# Patient Record
Sex: Female | Born: 1955 | Race: White | Hispanic: No | Smoking: Former smoker
Health system: Southern US, Community
[De-identification: ages and names within clinical notes are randomized; demographics above are authoritative.]

## PROBLEM LIST (undated history)

## (undated) DIAGNOSIS — M069 Rheumatoid arthritis, unspecified: Secondary | ICD-10-CM

## (undated) DIAGNOSIS — M810 Age-related osteoporosis without current pathological fracture: Secondary | ICD-10-CM

## (undated) DIAGNOSIS — C9590 Leukemia, unspecified not having achieved remission: Secondary | ICD-10-CM

## (undated) DIAGNOSIS — M1990 Unspecified osteoarthritis, unspecified site: Secondary | ICD-10-CM

## (undated) DIAGNOSIS — I10 Essential (primary) hypertension: Secondary | ICD-10-CM

## (undated) DIAGNOSIS — R569 Unspecified convulsions: Secondary | ICD-10-CM

## (undated) HISTORY — PX: APPENDECTOMY: SHX54

## (undated) HISTORY — PX: BACK SURGERY: SHX140

## (undated) HISTORY — PX: ABDOMINAL HYSTERECTOMY: SHX81

## (undated) HISTORY — PX: OTHER SURGICAL HISTORY: SHX169

## (undated) HISTORY — PX: SHOULDER SURGERY: SHX246

## (undated) HISTORY — PX: NECK SURGERY: SHX720

---

## 2016-08-01 ENCOUNTER — Emergency Department (HOSPITAL_BASED_OUTPATIENT_CLINIC_OR_DEPARTMENT_OTHER)
Admission: EM | Admit: 2016-08-01 | Discharge: 2016-08-01 | Disposition: A | Discharge status: Home / Self Care | Payer: Medicaid Other | Attending: Emergency Medicine | Admitting: Emergency Medicine

## 2016-08-01 ENCOUNTER — Encounter (HOSPITAL_BASED_OUTPATIENT_CLINIC_OR_DEPARTMENT_OTHER): Payer: Self-pay | Admitting: Emergency Medicine

## 2016-08-01 ENCOUNTER — Emergency Department (HOSPITAL_BASED_OUTPATIENT_CLINIC_OR_DEPARTMENT_OTHER): Payer: Medicaid Other

## 2016-08-01 DIAGNOSIS — S80212A Abrasion, left knee, initial encounter: Secondary | ICD-10-CM | POA: Diagnosis not present

## 2016-08-01 DIAGNOSIS — Y939 Activity, unspecified: Secondary | ICD-10-CM | POA: Diagnosis not present

## 2016-08-01 DIAGNOSIS — W010XXA Fall on same level from slipping, tripping and stumbling without subsequent striking against object, initial encounter: Secondary | ICD-10-CM | POA: Insufficient documentation

## 2016-08-01 DIAGNOSIS — Y92480 Sidewalk as the place of occurrence of the external cause: Secondary | ICD-10-CM | POA: Diagnosis not present

## 2016-08-01 DIAGNOSIS — Y999 Unspecified external cause status: Secondary | ICD-10-CM | POA: Insufficient documentation

## 2016-08-01 DIAGNOSIS — S62502A Fracture of unspecified phalanx of left thumb, initial encounter for closed fracture: Secondary | ICD-10-CM

## 2016-08-01 DIAGNOSIS — W19XXXA Unspecified fall, initial encounter: Secondary | ICD-10-CM

## 2016-08-01 DIAGNOSIS — I10 Essential (primary) hypertension: Secondary | ICD-10-CM | POA: Insufficient documentation

## 2016-08-01 DIAGNOSIS — S62522A Displaced fracture of distal phalanx of left thumb, initial encounter for closed fracture: Secondary | ICD-10-CM | POA: Diagnosis not present

## 2016-08-01 DIAGNOSIS — T07XXXA Unspecified multiple injuries, initial encounter: Secondary | ICD-10-CM

## 2016-08-01 DIAGNOSIS — S6992XA Unspecified injury of left wrist, hand and finger(s), initial encounter: Secondary | ICD-10-CM | POA: Diagnosis present

## 2016-08-01 HISTORY — DX: Leukemia, unspecified not having achieved remission: C95.90

## 2016-08-01 HISTORY — DX: Essential (primary) hypertension: I10

## 2016-08-01 HISTORY — DX: Age-related osteoporosis without current pathological fracture: M81.0

## 2016-08-01 HISTORY — DX: Unspecified osteoarthritis, unspecified site: M19.90

## 2016-08-01 MED ORDER — OXYCODONE-ACETAMINOPHEN 5-325 MG PO TABS: 1.0000 | ORAL_TABLET | ORAL | 0 refills | Status: DC | PRN

## 2016-08-01 MED ORDER — OXYCODONE-ACETAMINOPHEN 5-325 MG PO TABS
1.0000 | ORAL_TABLET | Freq: Once | ORAL | Status: AC
  Administered 2016-08-01: 1 via ORAL
  Filled 2016-08-01: qty 1

## 2016-08-01 MED ORDER — ONDANSETRON HCL 4 MG/2ML IJ SOLN
4.0000 mg | Freq: Once | INTRAMUSCULAR | Status: AC
  Administered 2016-08-01: 4 mg via INTRAVENOUS
  Filled 2016-08-01: qty 2

## 2016-08-01 MED ORDER — MORPHINE SULFATE (PF) 4 MG/ML IV SOLN
4.0000 mg | Freq: Once | INTRAVENOUS | Status: AC
  Administered 2016-08-01: 4 mg via INTRAVENOUS
  Filled 2016-08-01: qty 1

## 2016-08-01 MED ORDER — BACITRACIN ZINC 500 UNIT/GM EX OINT
TOPICAL_OINTMENT | Freq: Once | CUTANEOUS | Status: AC
  Administered 2016-08-01: 18:00:00 via TOPICAL
  Filled 2016-08-01: qty 28.35

## 2016-08-01 NOTE — ED Provider Notes (Signed)
[facility] Provider Note   CSN: [id number] Arrival date & time: 08/01/16  1705  By signing my name below, I, Emmanuella Mensah, attest that this documentation has been prepared under the direction and in the presence of Lonnie Reth, PA-C. Electronically Signed: Judithann Sauger, ED Scribe. 08/01/16. 6:17 PM.   History   Chief Complaint Chief Complaint  Patient presents with  . Fall    HPI Comments: Jennifer Hernandez is a 60 y.o. female with a hx of osteoporosis, arthritis, hypertension, and leukemia who presents to the Emergency Department complaining of gradually worsening moderate bilateral knee pain and moderate bilateral hand pain with multiple abrasions to the knee and dorsum of hands s/p fall that occurred last night. She reports associated swelling and bruising to her left thumb and limited ROM of her fingers secondary to pain. Pt explains that she tripped and fell forward onto her outstretched hands and knees onto the pavement when her boyfriend accidentally stepped on the back of her flip flop. No LOC or head injuries. She states that she cleaned the abrasions but no medications noted. She denies any fever, nausea, vomiting, wrist pain, back pain, generalized rash, or any other complaints at this time.   The history is provided by the patient. No language interpreter was used.    Past Medical History:  Diagnosis Date  . Arthritis   . Hypertension   . Leukemia ([place])   . Osteoporosis     There are no active problems to display for this patient.   Past Surgical History:  Procedure Laterality Date  . ABDOMINAL HYSTERECTOMY    . arm surgery    . CESAREAN SECTION    . SHOULDER SURGERY      OB History    No data available       Home Medications    Prior to Admission medications   Not on File    Family History No family history on file.  Social History Social History  Substance Use Topics  . Smoking status: Never Smoker  . Smokeless tobacco:  Never Used  . Alcohol use No     Allergies   Aspirin; Dimetapp cold-allergy [brompheniramine-phenylephrine]; Ibuprofen; and Penicillins   Review of Systems Review of Systems  Constitutional: Negative for chills and fever.  Gastrointestinal: Negative for nausea and vomiting.  Musculoskeletal: Positive for arthralgias and joint swelling.  Skin: Positive for wound. Negative for rash.  All other systems reviewed and are negative.    Physical Exam Updated Vital Signs BP 143/84 (BP Location: Left Arm)   Pulse 87   Temp 98.2 F (36.8 C) (Oral)   Resp 16   Ht 5\' 4"  (1.626 m)   Wt 155 lb (70.3 kg)   SpO2 95%   BMI 26.61 kg/m   Physical Exam  Constitutional: She is oriented to person, place, and time. She appears well-developed and well-nourished. No distress.  HENT:  Head: Normocephalic and atraumatic.  Eyes: Conjunctivae and EOM are normal.  Neck: Neck supple. No tracheal deviation present.  Cardiovascular: Normal rate.   Pulmonary/Chest: Effort normal. No respiratory distress.  Musculoskeletal: Normal range of motion.  Left thumb ecchymotic and edematous, diffusely tender. Limited finger-thumb opposition due to pain. +snuffbox tenderness of left hand. FROM of left wrist. Sensation intact throughout. 2+ radial pulse Right dorsal knuckles grossly edematous and ecchymotic. Fingers 2-4 volarly angulated. Few small abrasions. Tender. Finger thumb opposition intact but painful. Sensation intact throughout. 2+ radial pulse.  Limited grip strength bilateral hands due to pain  No other upper extremity tenderness or deformity No hip or pelvic tenderness or instability. FROM/ Right knee with mild edema and ecchymosis, FROM. Diffusely tender.  Right shin with mild ecchymosis and tenderness. Left knee with large superficial abrasion no foreign bodies. Knee diffusely tender mildly edematous.  Limited ROM due to pain. No ankle or foot tenderness or deformity. 2+ DP and PT bilaterally No  midline back tenderness no stepoff or deformity.  Neurological: She is alert and oriented to person, place, and time.  Skin: Skin is warm and dry.  Psychiatric: She has a normal mood and affect. Her behavior is normal.  Nursing note and vitals reviewed.    ED Treatments / Results  DIAGNOSTIC STUDIES: Oxygen Saturation is 95% on RA, normal by my interpretation.    COORDINATION OF CARE: 6:04 PM- Pt advised of plan for treatment and pt agrees. She will receive bilateral hand x-ray, bilateral knee, and right tib-fib for further evaluation. She will also receive Bacitracin and percocet for pain relief.    Radiology Dg Tibia/fibula Right  Result Date: 08/01/2016 CLINICAL DATA:  Right lower leg pain following fall last night. Initial encounter. EXAM: RIGHT TIBIA AND FIBULA - 2 VIEW COMPARISON:  None. FINDINGS: There is no evidence of fracture or other focal bone lesions. Soft tissues are unremarkable. IMPRESSION: Negative. Electronically Signed   By: Margarette Canada M.D.   On: 08/01/2016 19:16   Ct Hand Left Wo Contrast  Result Date: 08/01/2016 CLINICAL DATA:  Bilateral hand pain after fall last night. Swelling and bruising. Patient with history of rheumatoid arthritis. EXAM: CT OF THE LEFT HAND WITHOUT CONTRAST TECHNIQUE: Multidetector CT imaging of the left hand was performed according to the standard protocol. Multiplanar CT image reconstructions were also generated. COMPARISON:  Radiographs earlier this day FINDINGS: Bones/Joint/Cartilage Volar subluxation of the index and long finger proximal phalanx at the metacarpal phalangeal joint. Suspect this is chronic No acute fracture of the metacarpals or proximal phalanges. There is a lucency through the thumb distal phalanx suspicious for fracture, acuity uncertain. Multifocal subchondral cysts versus erosions throughout the carpal bones as well as involving the distal radius. Marked joint space narrowing of the radiocarpal and distal radioulnar joints.  There is widening of the scapholunate articulation. Probable carpal joint effusion. Calcification of the triangular fibrocartilage. Ligaments Suboptimally assessed by CT. Widening of the scapholunate joint spaces consistent with ligament tear, appears chronic. Muscles and Tendons No acute abnormality is visualized by CT. Soft tissues No confluent hematoma. IMPRESSION: Volar subluxation of the index and long finger at the metacarpal phalangeal joint. Suspect this is chronic and consistent with patient's history of rheumatoid arthritis. Chronic changes throughout the wrist and carpal bones. Lucency through the thumb distal phalanx is consistent with fracture, acuity uncertain. Recommend correlation with point tenderness, this may be remote. Electronically Signed   By: Jeb Levering M.D.   On: 08/01/2016 22:29   Ct Hand Right Wo Contrast  Result Date: 08/01/2016 CLINICAL DATA:  Bilateral hand pain after fall. History of rheumatoid arthritis. EXAM: CT OF THE RIGHT HAND WITHOUT CONTRAST TECHNIQUE: Multidetector CT imaging of the right hand was performed according to the standard protocol. Multiplanar CT image reconstructions were also generated. COMPARISON:  Radiographs earlier this date. FINDINGS: Bones/Joint/Cartilage Volar subluxation of the index finger, long finger, and ring finger, suspect this is chronic. There is associated joint space narrowing and proliferative change involving the index finger. Erosion is noted involving the third metacarpal head. No acute fracture in this region is seen.  Multifocal erosions and joint space narrowing throughout the carpal bones and distal radial ulnar joint with bony fragmentation consistent with rheumatoid arthritis. Surgical screw is seen in the distal radius. There is settling of the proximal carpal row. Lateral plate and screw fixation of the distal ulna. Probable small carpal effusion. Ligaments Suboptimally assessed by CT. Widening of the scapholunate and dural  consistent with ligamentous tear, most certainly chronic. Muscles and Tendons No acute abnormality. Soft tissues No confluent hematoma. IMPRESSION: Volar subluxation of the index, long, and ring fingers, most certainly chronic and secondary to patient's remote for arthritis. No acute fracture is seen. Chronic changes about the wrist and carpal bones consistent with known history of inflammatory arthropathy. Electronically Signed   By: Jeb Levering M.D.   On: 08/01/2016 22:33   Dg Knee Complete 4 Views Left  Result Date: 08/01/2016 CLINICAL DATA:  Acute left knee pain following fall last night. Initial encounter. EXAM: LEFT KNEE - COMPLETE 4+ VIEW COMPARISON:  None. FINDINGS: No evidence of fracture, dislocation, or joint effusion. No evidence of arthropathy or other focal bone abnormality. Soft tissues are unremarkable. IMPRESSION: Negative. Electronically Signed   By: Margarette Canada M.D.   On: 08/01/2016 19:15   Dg Knee Complete 4 Views Right  Result Date: 08/01/2016 CLINICAL DATA:  Acute right knee pain following fall last night. Initial encounter. EXAM: RIGHT KNEE - COMPLETE 4+ VIEW COMPARISON:  None. FINDINGS: No acute fracture, subluxation or dislocation identified. No knee effusion identified. Chondrocalcinosis identified. Tricompartmental degenerative changes are noted, severe in the medial compartment and mild in the lateral and patellofemoral compartments. IMPRESSION: No evidence of acute abnormality. Tricompartmental degenerative changes, severe in the medial compartment. Chondrocalcinosis. Electronically Signed   By: Margarette Canada M.D.   On: 08/01/2016 19:15   Dg Hand Complete Left  Result Date: 08/01/2016 CLINICAL DATA:  Left hand pain following fall.  Initial encounter. EXAM: LEFT HAND - COMPLETE 3+ VIEW COMPARISON:  None. FINDINGS: Volar dislocation of the second and third MCP joints noted. No definite fractures identified. Degenerative changes within the wrist are present. IMPRESSION: Volar  dislocation at the second and third MCP joints. Electronically Signed   By: Margarette Canada M.D.   On: 08/01/2016 19:14   Dg Hand Complete Right  Result Date: 08/01/2016 CLINICAL DATA:  Acute right hand pain following fall last night. Initial encounter. EXAM: RIGHT HAND - COMPLETE 3+ VIEW COMPARISON:  None. FINDINGS: Volar dislocation at the second, third and fourth MCP joints noted. No definite acute fractures are noted. Severe degenerative/posttraumatic changes in the wrist are noted. Surgical hardware in the distal ulna identified. IMPRESSION: Volar dislocation at the second, third and fourth MCP joints. Electronically Signed   By: Margarette Canada M.D.   On: 08/01/2016 19:12    Procedures Procedures (including critical care time)  Medications Ordered in ED Medications  oxyCODONE-acetaminophen (PERCOCET/ROXICET) 5-325 MG per tablet 1 tablet (1 tablet Oral Given 08/01/16 1814)  bacitracin ointment ( Topical Given 08/01/16 1815)  morphine 4 MG/ML injection 4 mg (4 mg Intravenous Given 08/01/16 2006)  ondansetron (ZOFRAN) injection 4 mg (4 mg Intravenous Given 08/01/16 2005)  morphine 4 MG/ML injection 4 mg (4 mg Intravenous Given 08/01/16 2220)     Initial Impression / Assessment and Plan / ED Course  Delrae Rend, PA-C has reviewed the triage vital signs and the nursing notes.  Pertinent labs & imaging results that were available during my care of the patient were reviewed by me and considered in my medical  decision making (see chart for details).  Clinical Course    Initial hand X-rays showed dislocation of several MCP joints bilaterally. Dr. Winfred Leeds independently evaluated patient and agrees that this is unusual and recommended hand surgery consultation for further recommendations. Rest of x-rays were negative. After discussion with Dr. Grandville Silos of hand surgery and further Care Everywhere review, pt does have a history of rheumatoid arthritis (had initially only reported osteoarthritis) and Dr.  Grandville Silos feels these dislocations could likely be chronic secondary to her RA and recommends CT of both hands. CT were obtained and the dislocations do appear chronic. However, she does have a distal phalanx fracture of her left thumb which is the area that is giving her the most pain tonight. She was placed in a finger splint and I encouraged close follow up with hand surgery/ortho. Pt sees Dr. Laurance Flatten at [facility] ([facility] and would like to follow up at his practice. Rx given for supportive meds. Care instructions discussed for pt's abrasions. ER return precautions given.  Final Clinical Impressions(s) / ED Diagnoses   Final diagnoses:  Fall, initial encounter  Thumb fracture, left, closed, initial encounter  Multiple abrasions    New Prescriptions Discharge Medication List as of 08/01/2016 11:10 PM    START taking these medications   Details  oxyCODONE-acetaminophen (PERCOCET/ROXICET) 5-325 MG tablet Take 1 tablet by mouth every 4 (four) hours as needed for severe pain., Starting Sat 08/01/2016, Print       I personally performed the services described in this documentation, which was scribed in my presence. The recorded information has been reviewed and is accurate.    Anne Ng, PA-C 08/02/16 2155    Orlie Dakin, MD 08/03/16 [phone]

## 2016-08-01 NOTE — ED Provider Notes (Signed)
Patient tripped and fell 12 midnight tonight on her outstretched hands. Complains of bilateral hand pain and bilateral knee pain since event. On exam alert Glasgow Coma Score 15 bilateral upper extremities hands with deformities at MCP joints. All digits with good capillary refill. Lower extremities with abrasions overlying anterior knees. Neurovascularly intact.   Orlie Dakin, MD 08/01/16 2330

## 2016-08-01 NOTE — Discharge Instructions (Signed)
Take pain medication as prescribed as needed for pain. Follow up with your orthopedic surgeon as soon as possible.

## 2016-08-01 NOTE — ED Triage Notes (Signed)
Pt states she tripped and fell on pavement last night when boyfriend stepped on her flip flop.  Pt c/o right hand pain, left thumb and left knee pain.  Visible swelling and bruising to each.

## 2016-12-17 ENCOUNTER — Observation Stay (HOSPITAL_BASED_OUTPATIENT_CLINIC_OR_DEPARTMENT_OTHER)
Admission: EM | Admit: 2016-12-17 | Discharge: 2016-12-18 | Disposition: A | Discharge status: Home / Self Care | Payer: Medicaid Other | Attending: Internal Medicine | Admitting: Internal Medicine

## 2016-12-17 ENCOUNTER — Emergency Department (HOSPITAL_BASED_OUTPATIENT_CLINIC_OR_DEPARTMENT_OTHER): Payer: Medicaid Other

## 2016-12-17 ENCOUNTER — Encounter (HOSPITAL_BASED_OUTPATIENT_CLINIC_OR_DEPARTMENT_OTHER): Payer: Self-pay | Admitting: *Deleted

## 2016-12-17 DIAGNOSIS — Z888 Allergy status to other drugs, medicaments and biological substances status: Secondary | ICD-10-CM | POA: Insufficient documentation

## 2016-12-17 DIAGNOSIS — D803 Selective deficiency of immunoglobulin G [IgG] subclasses: Secondary | ICD-10-CM | POA: Insufficient documentation

## 2016-12-17 DIAGNOSIS — M069 Rheumatoid arthritis, unspecified: Secondary | ICD-10-CM | POA: Diagnosis present

## 2016-12-17 DIAGNOSIS — D899 Disorder involving the immune mechanism, unspecified: Secondary | ICD-10-CM | POA: Diagnosis present

## 2016-12-17 DIAGNOSIS — Z886 Allergy status to analgesic agent status: Secondary | ICD-10-CM | POA: Diagnosis not present

## 2016-12-17 DIAGNOSIS — I700 Atherosclerosis of aorta: Secondary | ICD-10-CM | POA: Insufficient documentation

## 2016-12-17 DIAGNOSIS — Z87891 Personal history of nicotine dependence: Secondary | ICD-10-CM | POA: Insufficient documentation

## 2016-12-17 DIAGNOSIS — E876 Hypokalemia: Secondary | ICD-10-CM | POA: Diagnosis not present

## 2016-12-17 DIAGNOSIS — D849 Immunodeficiency, unspecified: Secondary | ICD-10-CM

## 2016-12-17 DIAGNOSIS — Z880 Allergy status to penicillin: Secondary | ICD-10-CM | POA: Diagnosis not present

## 2016-12-17 DIAGNOSIS — M810 Age-related osteoporosis without current pathological fracture: Secondary | ICD-10-CM | POA: Diagnosis not present

## 2016-12-17 DIAGNOSIS — Z90710 Acquired absence of both cervix and uterus: Secondary | ICD-10-CM | POA: Diagnosis not present

## 2016-12-17 DIAGNOSIS — J209 Acute bronchitis, unspecified: Principal | ICD-10-CM | POA: Diagnosis present

## 2016-12-17 DIAGNOSIS — C9590 Leukemia, unspecified not having achieved remission: Secondary | ICD-10-CM | POA: Diagnosis not present

## 2016-12-17 DIAGNOSIS — J45909 Unspecified asthma, uncomplicated: Secondary | ICD-10-CM | POA: Insufficient documentation

## 2016-12-17 DIAGNOSIS — I10 Essential (primary) hypertension: Secondary | ICD-10-CM | POA: Diagnosis present

## 2016-12-17 DIAGNOSIS — J189 Pneumonia, unspecified organism: Secondary | ICD-10-CM | POA: Diagnosis present

## 2016-12-17 DIAGNOSIS — Z79899 Other long term (current) drug therapy: Secondary | ICD-10-CM | POA: Diagnosis not present

## 2016-12-17 DIAGNOSIS — Z856 Personal history of leukemia: Secondary | ICD-10-CM | POA: Diagnosis not present

## 2016-12-17 HISTORY — DX: Rheumatoid arthritis, unspecified: M06.9

## 2016-12-17 HISTORY — DX: Unspecified convulsions: R56.9

## 2016-12-17 LAB — TROPONIN I

## 2016-12-17 LAB — CBC WITH DIFFERENTIAL/PLATELET
BASOS ABS: 0 10*3/uL (ref 0.0–0.1)
BASOS PCT: 0 %
EOS ABS: 0 10*3/uL (ref 0.0–0.7)
Eosinophils Relative: 0 %
HEMATOCRIT: 35.8 % — AB (ref 36.0–46.0)
Hemoglobin: 12.1 g/dL (ref 12.0–15.0)
Lymphocytes Relative: 6 %
Lymphs Abs: 0.7 10*3/uL (ref 0.7–4.0)
MCH: 32.4 pg (ref 26.0–34.0)
MCHC: 33.8 g/dL (ref 30.0–36.0)
MCV: 95.7 fL (ref 78.0–100.0)
MONO ABS: 0.4 10*3/uL (ref 0.1–1.0)
Monocytes Relative: 3 %
NEUTROS ABS: 10.9 10*3/uL — AB (ref 1.7–7.7)
NEUTROS PCT: 91 %
Platelets: 338 10*3/uL (ref 150–400)
RBC: 3.74 MIL/uL — ABNORMAL LOW (ref 3.87–5.11)
RDW: 15.7 % — AB (ref 11.5–15.5)
WBC: 12 10*3/uL — ABNORMAL HIGH (ref 4.0–10.5)

## 2016-12-17 LAB — BASIC METABOLIC PANEL
Anion gap: 8 (ref 5–15)
BUN: 17 mg/dL (ref 6–20)
CALCIUM: 8.9 mg/dL (ref 8.9–10.3)
CHLORIDE: 104 mmol/L (ref 101–111)
CO2: 26 mmol/L (ref 22–32)
Creatinine, Ser: 0.41 mg/dL — ABNORMAL LOW (ref 0.44–1.00)
Glucose, Bld: 95 mg/dL (ref 65–99)
Potassium: 3.2 mmol/L — ABNORMAL LOW (ref 3.5–5.1)
SODIUM: 138 mmol/L (ref 135–145)

## 2016-12-17 LAB — INFLUENZA PANEL BY PCR (TYPE A & B)
Influenza A By PCR: NEGATIVE
Influenza B By PCR: NEGATIVE

## 2016-12-17 LAB — URINALYSIS, ROUTINE W REFLEX MICROSCOPIC
Bilirubin Urine: NEGATIVE
GLUCOSE, UA: NEGATIVE mg/dL
HGB URINE DIPSTICK: NEGATIVE
Ketones, ur: NEGATIVE mg/dL
LEUKOCYTES UA: NEGATIVE
Nitrite: NEGATIVE
PH: 6.5 (ref 5.0–8.0)
Protein, ur: NEGATIVE mg/dL
SPECIFIC GRAVITY, URINE: 1.012 (ref 1.005–1.030)

## 2016-12-17 LAB — I-STAT CG4 LACTIC ACID, ED: LACTIC ACID, VENOUS: 1.91 mmol/L — AB (ref 0.5–1.9)

## 2016-12-17 LAB — PROTIME-INR
INR: 0.92
Prothrombin Time: 12.3 seconds (ref 11.4–15.2)

## 2016-12-17 LAB — TSH: TSH: 0.112 u[IU]/mL — AB (ref 0.350–4.500)

## 2016-12-17 MED ORDER — ONDANSETRON HCL 4 MG PO TABS: 4.0000 mg | ORAL_TABLET | Freq: Four times a day (QID) | ORAL | Status: DC | PRN

## 2016-12-17 MED ORDER — ACETAMINOPHEN 650 MG RE SUPP
650.0000 mg | Freq: Four times a day (QID) | RECTAL | Status: DC | PRN
Start: 2016-12-17 — End: 2016-12-18

## 2016-12-17 MED ORDER — QUETIAPINE FUMARATE 400 MG PO TABS
400.0000 mg | ORAL_TABLET | Freq: Every day | ORAL | Status: DC
  Administered 2016-12-17: 400 mg via ORAL
  Filled 2016-12-17: qty 1

## 2016-12-17 MED ORDER — LEFLUNOMIDE 20 MG PO TABS
10.0000 mg | ORAL_TABLET | Freq: Every day | ORAL | Status: DC
  Administered 2016-12-18: 10 mg via ORAL
  Filled 2016-12-17: qty 1

## 2016-12-17 MED ORDER — SODIUM CHLORIDE 0.9 % IV BOLUS (SEPSIS)
1000.0000 mL | Freq: Once | INTRAVENOUS | Status: AC
  Administered 2016-12-17: 1000 mL via INTRAVENOUS

## 2016-12-17 MED ORDER — METHYLPREDNISOLONE SODIUM SUCC 125 MG IJ SOLR
125.0000 mg | Freq: Once | INTRAMUSCULAR | Status: AC
  Administered 2016-12-17: 125 mg via INTRAVENOUS
  Filled 2016-12-17: qty 2

## 2016-12-17 MED ORDER — HYDROCHLOROTHIAZIDE 25 MG PO TABS
25.0000 mg | ORAL_TABLET | Freq: Every day | ORAL | Status: DC
  Administered 2016-12-18: 25 mg via ORAL
  Filled 2016-12-17: qty 1

## 2016-12-17 MED ORDER — SPIRONOLACTONE 25 MG PO TABS
25.0000 mg | ORAL_TABLET | Freq: Every day | ORAL | Status: DC
  Administered 2016-12-18: 25 mg via ORAL
  Filled 2016-12-17: qty 1

## 2016-12-17 MED ORDER — SENNOSIDES-DOCUSATE SODIUM 8.6-50 MG PO TABS
2.0000 | ORAL_TABLET | Freq: Two times a day (BID) | ORAL | Status: DC
  Administered 2016-12-17 – 2016-12-18 (×2): 2 via ORAL
  Filled 2016-12-17 (×2): qty 2

## 2016-12-17 MED ORDER — ALBUTEROL SULFATE (2.5 MG/3ML) 0.083% IN NEBU
5.0000 mg | INHALATION_SOLUTION | Freq: Once | RESPIRATORY_TRACT | Status: AC
  Administered 2016-12-17: 5 mg via RESPIRATORY_TRACT
  Filled 2016-12-17: qty 6

## 2016-12-17 MED ORDER — PANTOPRAZOLE SODIUM 40 MG PO TBEC
40.0000 mg | DELAYED_RELEASE_TABLET | Freq: Every day | ORAL | Status: DC
  Administered 2016-12-18: 40 mg via ORAL
  Filled 2016-12-17: qty 1

## 2016-12-17 MED ORDER — GABAPENTIN 300 MG PO CAPS
600.0000 mg | ORAL_CAPSULE | Freq: Every day | ORAL | Status: DC
  Administered 2016-12-17: 600 mg via ORAL
  Filled 2016-12-17: qty 2

## 2016-12-17 MED ORDER — ACETAMINOPHEN 325 MG PO TABS: 650.0000 mg | ORAL_TABLET | Freq: Four times a day (QID) | ORAL | Status: DC | PRN

## 2016-12-17 MED ORDER — POTASSIUM CHLORIDE CRYS ER 20 MEQ PO TBCR
40.0000 meq | EXTENDED_RELEASE_TABLET | Freq: Once | ORAL | Status: AC
  Administered 2016-12-17: 40 meq via ORAL
  Filled 2016-12-17: qty 2

## 2016-12-17 MED ORDER — FLUTICASONE PROPIONATE 50 MCG/ACT NA SUSP
1.0000 | Freq: Every day | NASAL | Status: DC
  Administered 2016-12-18: 1 via NASAL
  Filled 2016-12-17: qty 16

## 2016-12-17 MED ORDER — ETANERCEPT 50 MG/ML ~~LOC~~ SOCT: 50.0000 mg | SUBCUTANEOUS | Status: DC

## 2016-12-17 MED ORDER — LEVOFLOXACIN 750 MG PO TABS
750.0000 mg | ORAL_TABLET | Freq: Every day | ORAL | Status: DC
  Administered 2016-12-18: 750 mg via ORAL
  Filled 2016-12-17: qty 1

## 2016-12-17 MED ORDER — CALCIUM CARBONATE-VITAMIN D 500-200 MG-UNIT PO TABS
2.0000 | ORAL_TABLET | Freq: Every day | ORAL | Status: DC
  Administered 2016-12-18: 08:00:00 2 via ORAL
  Filled 2016-12-17: qty 1

## 2016-12-17 MED ORDER — CALCIUM CARB-CHOLECALCIFEROL 1000-800 MG-UNIT PO TABS: 1.0000 | ORAL_TABLET | Freq: Every day | ORAL | Status: DC

## 2016-12-17 MED ORDER — HEPARIN SODIUM (PORCINE) 5000 UNIT/ML IJ SOLN
5000.0000 [IU] | Freq: Three times a day (TID) | INTRAMUSCULAR | Status: DC
  Administered 2016-12-17 – 2016-12-18 (×2): 5000 [IU] via SUBCUTANEOUS
  Filled 2016-12-17 (×2): qty 1

## 2016-12-17 MED ORDER — BUTALBITAL-APAP-CAFFEINE 50-325-40 MG PO TABS
1.0000 | ORAL_TABLET | Freq: Once | ORAL | Status: DC
  Filled 2016-12-17: qty 1

## 2016-12-17 MED ORDER — IPRATROPIUM-ALBUTEROL 0.5-2.5 (3) MG/3ML IN SOLN
3.0000 mL | RESPIRATORY_TRACT | Status: DC
  Administered 2016-12-17: 3 mL via RESPIRATORY_TRACT
  Filled 2016-12-17: qty 3

## 2016-12-17 MED ORDER — AMLODIPINE BESYLATE 5 MG PO TABS
5.0000 mg | ORAL_TABLET | Freq: Every day | ORAL | Status: DC
Start: 2016-12-18 — End: 2016-12-18
  Administered 2016-12-18: 5 mg via ORAL
  Filled 2016-12-17: qty 1

## 2016-12-17 MED ORDER — DEXTROSE 5 % IV SOLN
500.0000 mg | Freq: Once | INTRAVENOUS | Status: AC
  Administered 2016-12-17: 500 mg via INTRAVENOUS

## 2016-12-17 MED ORDER — BUSPIRONE HCL 10 MG PO TABS
30.0000 mg | ORAL_TABLET | Freq: Two times a day (BID) | ORAL | Status: DC
  Administered 2016-12-17 – 2016-12-18 (×2): 30 mg via ORAL
  Filled 2016-12-17 (×2): qty 3

## 2016-12-17 MED ORDER — SPIRONOLACTONE-HCTZ 25-25 MG PO TABS: 1.0000 | ORAL_TABLET | Freq: Every day | ORAL | Status: DC

## 2016-12-17 MED ORDER — IPRATROPIUM-ALBUTEROL 0.5-2.5 (3) MG/3ML IN SOLN: 3.0000 mL | Freq: Four times a day (QID) | RESPIRATORY_TRACT | Status: DC

## 2016-12-17 MED ORDER — ONDANSETRON HCL 4 MG/2ML IJ SOLN: 4.0000 mg | Freq: Four times a day (QID) | INTRAMUSCULAR | Status: DC | PRN

## 2016-12-17 MED ORDER — DEXTROSE 5 % IV SOLN
1.0000 g | Freq: Once | INTRAVENOUS | Status: AC
  Administered 2016-12-17: 1 g via INTRAVENOUS
  Filled 2016-12-17: qty 10

## 2016-12-17 MED ORDER — DIPHENHYDRAMINE HCL 50 MG/ML IJ SOLN
12.5000 mg | Freq: Once | INTRAMUSCULAR | Status: AC
  Administered 2016-12-17: 12.5 mg via INTRAVENOUS
  Filled 2016-12-17: qty 1

## 2016-12-17 MED ORDER — PREDNISONE 10 MG PO TABS
10.0000 mg | ORAL_TABLET | Freq: Every day | ORAL | Status: DC
  Administered 2016-12-18: 10 mg via ORAL
  Filled 2016-12-17: qty 1

## 2016-12-17 MED ORDER — HYDROCODONE-ACETAMINOPHEN 5-325 MG PO TABS
1.0000 | ORAL_TABLET | ORAL | Status: DC | PRN
  Administered 2016-12-17 – 2016-12-18 (×4): 2 via ORAL
  Filled 2016-12-17 (×4): qty 2

## 2016-12-17 MED ORDER — METOCLOPRAMIDE HCL 5 MG/ML IJ SOLN
10.0000 mg | Freq: Once | INTRAMUSCULAR | Status: AC
  Administered 2016-12-17: 10 mg via INTRAVENOUS
  Filled 2016-12-17: qty 2

## 2016-12-17 MED ORDER — ALBUTEROL SULFATE HFA 108 (90 BASE) MCG/ACT IN AERS: 2.0000 | INHALATION_SPRAY | RESPIRATORY_TRACT | Status: DC | PRN

## 2016-12-17 MED ORDER — CITALOPRAM HYDROBROMIDE 20 MG PO TABS
20.0000 mg | ORAL_TABLET | Freq: Every day | ORAL | Status: DC
  Administered 2016-12-18: 20 mg via ORAL
  Filled 2016-12-17: qty 1

## 2016-12-17 MED ORDER — ACETAMINOPHEN 325 MG PO TABS
650.0000 mg | ORAL_TABLET | Freq: Once | ORAL | Status: AC
  Administered 2016-12-17: 650 mg via ORAL
  Filled 2016-12-17: qty 2

## 2016-12-17 MED ORDER — ACETAMINOPHEN 325 MG PO TABS: 650.0000 mg | ORAL_TABLET | Freq: Once | ORAL | Status: DC

## 2016-12-17 MED ORDER — AZITHROMYCIN 500 MG IV SOLR
INTRAVENOUS | Status: AC
  Filled 2016-12-17: qty 500

## 2016-12-17 MED ORDER — ALBUTEROL SULFATE (2.5 MG/3ML) 0.083% IN NEBU: 2.5000 mg | INHALATION_SOLUTION | RESPIRATORY_TRACT | Status: DC | PRN

## 2016-12-17 MED ORDER — FOLIC ACID 1 MG PO TABS
1.0000 mg | ORAL_TABLET | Freq: Every day | ORAL | Status: DC
  Administered 2016-12-18: 1 mg via ORAL
  Filled 2016-12-17: qty 1

## 2016-12-17 MED ORDER — GUAIFENESIN ER 600 MG PO TB12
1200.0000 mg | ORAL_TABLET | Freq: Two times a day (BID) | ORAL | Status: DC
  Administered 2016-12-17 – 2016-12-18 (×2): 1200 mg via ORAL
  Filled 2016-12-17 (×2): qty 2

## 2016-12-17 MED ORDER — POTASSIUM CHLORIDE CRYS ER 10 MEQ PO TBCR: 10.0000 meq | EXTENDED_RELEASE_TABLET | Freq: Two times a day (BID) | ORAL | Status: DC

## 2016-12-17 MED ORDER — MIRABEGRON ER 25 MG PO TB24
25.0000 mg | ORAL_TABLET | Freq: Two times a day (BID) | ORAL | Status: DC
  Administered 2016-12-17 – 2016-12-18 (×2): 25 mg via ORAL
  Filled 2016-12-17 (×2): qty 1

## 2016-12-17 MED ORDER — METHOTREXATE 2.5 MG PO TABS: 15.0000 mg | ORAL_TABLET | ORAL | Status: DC

## 2016-12-17 MED ORDER — IPRATROPIUM BROMIDE 0.02 % IN SOLN
0.5000 mg | Freq: Once | RESPIRATORY_TRACT | Status: AC
  Administered 2016-12-17: 0.5 mg via RESPIRATORY_TRACT
  Filled 2016-12-17: qty 2.5

## 2016-12-17 MED ORDER — METHOCARBAMOL 500 MG PO TABS
750.0000 mg | ORAL_TABLET | Freq: Four times a day (QID) | ORAL | Status: DC
  Administered 2016-12-17 – 2016-12-18 (×3): 750 mg via ORAL
  Filled 2016-12-17 (×3): qty 2

## 2016-12-17 MED ORDER — PRAMIPEXOLE DIHYDROCHLORIDE 0.25 MG PO TABS
0.2500 mg | ORAL_TABLET | Freq: Three times a day (TID) | ORAL | Status: DC
  Administered 2016-12-17 – 2016-12-18 (×2): 0.25 mg via ORAL
  Filled 2016-12-17 (×4): qty 1

## 2016-12-17 MED ORDER — SODIUM CHLORIDE 0.9 % IV SOLN: INTRAVENOUS | Status: DC

## 2016-12-17 MED ORDER — GUAIFENESIN-DM 100-10 MG/5ML PO SYRP: 5.0000 mL | ORAL_SOLUTION | ORAL | Status: DC | PRN

## 2016-12-17 MED ORDER — ESTROGENS CONJUGATED 1.25 MG PO TABS
1.2500 mg | ORAL_TABLET | Freq: Every day | ORAL | Status: DC
  Administered 2016-12-18: 1.25 mg via ORAL
  Filled 2016-12-17: qty 1

## 2016-12-17 NOTE — ED Notes (Signed)
Report given to [facility] on 3[facility].

## 2016-12-17 NOTE — ED Notes (Signed)
Talked with Lattie Haw PA about lactic acid and she states to cancel 2nd lactic acid.

## 2016-12-17 NOTE — ED Notes (Signed)
Turned over care to [facility] transport team. PT in stable condition.

## 2016-12-17 NOTE — ED Notes (Signed)
Report given to [facility] EMT-P with [facility].

## 2016-12-17 NOTE — ED Notes (Signed)
[facility] here to transport pt to [facility] 

## 2016-12-17 NOTE — Plan of Care (Addendum)
   Jennifer Hernandez is a 61 y.o. female with a Past Medical History significant for leukemia and getting active treatment IG deficiency replacment who presents with CAP. Found to be in respiratory distress. Case was discussed by EDP with patient's oncologist and they advised inpatient admission for IV antibiotics.  Getting chemo actively. On immunosuppressants for RA.  EDP to order flu swab.  Accepted as tele obs admit to [facility].  Elwin Mocha, MD Family Medicine See Amion for pager # [facility]

## 2016-12-17 NOTE — ED Notes (Signed)
Attempted IV x 2 unsuccessful, to [place] and [facility]

## 2016-12-17 NOTE — H&P (Signed)
History and Physical    [name] [id number] DOB: 08-Oct-1956 DOA: 12/17/2016  PCP: Sinclair Ship, MD  Patient coming from: Home  Chief Complaint: SOB and cough  HPI: Jennifer Hernandez is a 61 y.o. female with medical history significant of leukemia, IgG deficiency, has history of rheumatoid arthritis on immunomodulators, patient came into the hospital complaining about subacute cough. Patient reported that before Christmas she had cough that comes and goes, she went to see her primary care physician twice for this. For the past 2-3 days she reported fever of 101 at home, cough is productive of some phlegm which described as yellow. She denies any other complaints, denies any chest pain denies any palpitations.  ED Course:  Vitals: WNL, no fever documented in the ED. Labs: Hypokalemia with potassium of 3.2 Imaging: CXR without findings to suggest pneumonia Interventions: Referred for observation  Review of Systems:  Constitutional: Fever Eyes: negative for irritation, redness and visual disturbance Ears, nose, mouth, throat, and face: negative for earaches, epistaxis, nasal congestion and sore throat Respiratory: Per history of present illness Cardiovascular: negative for chest pain, dyspnea, lower extremity edema, orthopnea, palpitations and syncope Gastrointestinal: negative for abdominal pain, constipation, diarrhea, melena, nausea and vomiting Genitourinary:negative for dysuria, frequency and hematuria Hematologic/lymphatic: negative for bleeding, easy bruising and lymphadenopathy Musculoskeletal:negative for arthralgias, muscle weakness and stiff joints Neurological: negative for coordination problems, gait problems, headaches and weakness Endocrine: negative for diabetic symptoms including polydipsia, polyuria and weight loss Allergic/Immunologic: negative for anaphylaxis, hay fever and urticaria  Past Medical History:  Diagnosis Date  . Arthritis   . Hypertension   .  Leukemia ([place])   . Osteoporosis   . RA (rheumatoid arthritis) ([place])   . Seizures ([place])    last seizure in 2012    Past Surgical History:  Procedure Laterality Date  . ABDOMINAL HYSTERECTOMY    . APPENDECTOMY    . arm surgery    . BACK SURGERY    . CESAREAN SECTION    . NECK SURGERY    . SHOULDER SURGERY       reports that she has quit smoking. She has never used smokeless tobacco. She reports that she does not drink alcohol or use drugs.  Allergies  Allergen Reactions  . Aspirin Shortness Of Breath  . Brompheniramine Anaphylaxis  . Dimetapp Cold-Allergy [Brompheniramine-Phenylephrine] Shortness Of Breath  . Ibuprofen Shortness Of Breath  . Penicillins Shortness Of Breath  . Tolmetin Anaphylaxis  . Doxycycline Nausea And Vomiting  . Penicillin G Rash   Family history Denies any premature history of CAD.  Prior to Admission medications   Medication Sig Start Date End Date Taking? Authorizing Provider  albuterol (PROVENTIL HFA;VENTOLIN HFA) 108 (90 Base) MCG/ACT inhaler Inhale 2 puffs into the lungs every 4 (four) hours as needed for wheezing or shortness of breath.   Yes Historical Provider, MD  amLODipine (NORVASC) 5 MG tablet Take 5 mg by mouth daily.   Yes Historical Provider, MD  busPIRone (BUSPAR) 10 MG tablet Take 10 mg by mouth 3 (three) times daily.   Yes Historical Provider, MD  Calcium Carb-Cholecalciferol (CALCIUM 1000 + D) 1000-800 MG-UNIT TABS Take 1 tablet by mouth daily.   Yes Historical Provider, MD  citalopram (CELEXA) 20 MG tablet Take 20 mg by mouth daily.   Yes Historical Provider, MD  esomeprazole (NEXIUM) 20 MG capsule Take 20 mg by mouth 2 (two) times daily before a meal.   Yes Historical Provider, MD  estrogens, conjugated, (PREMARIN) 1.25 MG tablet  Take 1.25 mg by mouth daily.   Yes Historical Provider, MD  Etanercept 50 MG/ML SOCT Inject 50 mg into the skin once a week.   Yes Historical Provider, MD  fexofenadine (ALLEGRA) 60 MG tablet Take 60 mg by  mouth 2 (two) times daily.   Yes Historical Provider, MD  fluticasone (FLONASE) 50 MCG/ACT nasal spray Place 1 spray into both nostrils daily.   Yes Historical Provider, MD  Fluticasone-Salmeterol (ADVAIR) 500-50 MCG/DOSE AEPB Inhale 1 puff into the lungs 2 (two) times daily.   Yes Historical Provider, MD  folic acid (FOLVITE) 1 MG tablet Take 1 mg by mouth daily.   Yes Historical Provider, MD  gabapentin (NEURONTIN) 300 MG capsule Take 600 mg by mouth at bedtime.   Yes Historical Provider, MD  leflunomide (ARAVA) 10 MG tablet Take 10 mg by mouth daily.   Yes Historical Provider, MD  methocarbamol (ROBAXIN) 750 MG tablet Take 750 mg by mouth 4 (four) times daily.   Yes Historical Provider, MD  methotrexate 2.5 MG tablet Take 15 mg by mouth once a week.   Yes Historical Provider, MD  mirabegron ER (MYRBETRIQ) 25 MG TB24 tablet Take 25 mg by mouth 2 (two) times daily.   Yes Historical Provider, MD  oxyCODONE-acetaminophen (PERCOCET/ROXICET) 5-325 MG tablet Take 1 tablet by mouth every 4 (four) hours as needed for severe pain. 08/01/16  Yes Olivia Canter Sam, PA-C  potassium chloride (K-DUR,KLOR-CON) 10 MEQ tablet Take 10 mEq by mouth 2 (two) times daily.   Yes Historical Provider, MD  pramipexole (MIRAPEX) 0.25 MG tablet Take 0.25 mg by mouth 3 (three) times daily.   Yes Historical Provider, MD  predniSONE (DELTASONE) 5 MG tablet Take 5 mg by mouth daily with breakfast.   Yes Historical Provider, MD  QUEtiapine (SEROQUEL) 400 MG tablet Take 400 mg by mouth at bedtime.   Yes Historical Provider, MD  senna-docusate (SENOKOT-S) 8.6-50 MG tablet Take 2 tablets by mouth 2 (two) times daily.   Yes Historical Provider, MD  spironolactone-hydrochlorothiazide (ALDACTAZIDE) 25-25 MG tablet Take 1 tablet by mouth daily.   Yes Historical Provider, MD    Physical Exam:  Vitals:   12/17/16 1430 12/17/16 1509 12/17/16 1530 12/17/16 1630  BP: 123/58 108/61 109/63 (!) 143/75  Pulse: 93 107 103 (!) 107  Resp: 19 20 22   (!) 24  Temp:    98.3 F (36.8 C)  TempSrc:    Oral  SpO2: 95% 97% 97% 97%  Weight:    70.5 kg (155 lb 6.4 oz)  Height:    5\' 4"  (1.626 m)    Constitutional: NAD, calm, comfortable Eyes: PERRL, lids and conjunctivae normal ENMT: Mucous membranes are moist. Posterior pharynx clear of any exudate or lesions.Normal dentition.  Neck: normal, supple, no masses, no thyromegaly Respiratory: clear to auscultation bilaterally, no wheezing, no crackles. Normal respiratory effort. No accessory muscle use.  Cardiovascular: Regular rate and rhythm, no murmurs / rubs / gallops. No extremity edema. 2+ pedal pulses. No carotid bruits.  Abdomen: no tenderness, no masses palpated. No hepatosplenomegaly. Bowel sounds positive.  Musculoskeletal: no clubbing / cyanosis. No joint deformity upper and lower extremities. Good ROM, no contractures. Normal muscle tone.  Skin: no rashes, lesions, ulcers. No induration Neurologic: CN 2-12 grossly intact. Sensation intact, DTR normal. Strength 5/5 in all 4.  Psychiatric: Normal judgment and insight. Alert and oriented x 3. Normal mood.   Labs on Admission: I have personally reviewed following labs and imaging studies  CBC:  Recent  Labs Lab 12/17/16 1135  WBC 12.0*  NEUTROABS 10.9*  HGB 12.1  HCT 35.8*  MCV 95.7  PLT [id number]   Basic Metabolic Panel:  Recent Labs Lab 12/17/16 1135  NA 138  K 3.2*  CL 104  CO2 26  GLUCOSE 95  BUN 17  CREATININE 0.41*  CALCIUM 8.9   GFR: Estimated Creatinine Clearance: 73.8 mL/min (by C-G formula based on SCr of 0.41 mg/dL (L)). Liver Function Tests: No results for input(s): AST, ALT, ALKPHOS, BILITOT, PROT, ALBUMIN in the last 168 hours. No results for input(s): LIPASE, AMYLASE in the last 168 hours. No results for input(s): AMMONIA in the last 168 hours. Coagulation Profile: No results for input(s): INR, PROTIME in the last 168 hours. Cardiac Enzymes:  Recent Labs Lab 12/17/16 1135  TROPONINI <0.03   BNP  (last 3 results) No results for input(s): PROBNP in the last 8760 hours. HbA1C: No results for input(s): HGBA1C in the last 72 hours. CBG: No results for input(s): GLUCAP in the last 168 hours. Lipid Profile: No results for input(s): CHOL, HDL, LDLCALC, TRIG, CHOLHDL, LDLDIRECT in the last 72 hours. Thyroid Function Tests: No results for input(s): TSH, T4TOTAL, FREET4, T3FREE, THYROIDAB in the last 72 hours. Anemia Panel: No results for input(s): VITAMINB12, FOLATE, FERRITIN, TIBC, IRON, RETICCTPCT in the last 72 hours. Urine analysis:    Component Value Date/Time   COLORURINE YELLOW 12/17/2016 1225   APPEARANCEUR CLEAR 12/17/2016 1225   LABSPEC 1.012 12/17/2016 1225   PHURINE 6.5 12/17/2016 1225   GLUCOSEU NEGATIVE 12/17/2016 1225   HGBUR NEGATIVE 12/17/2016 [place] 12/17/2016 1225   KETONESUR NEGATIVE 12/17/2016 1225   PROTEINUR NEGATIVE 12/17/2016 1225   NITRITE NEGATIVE 12/17/2016 1225   LEUKOCYTESUR NEGATIVE 12/17/2016 1225   Sepsis Labs: !!!!!!!!!!!!!!!!!!!!!!!!!!!!!!!!!!!!!!!!!!!! Invalid input(s): PROCALCITONIN, LACTICIDVEN Recent Results (from the past 240 hour(s))  Blood culture (routine x 2)     Status: None (Preliminary result)   Collection Time: 12/17/16 12:00 PM  Result Value Ref Range Status   Specimen Description   Final    BLOOD LEFT ARM Performed at [facility], [address], [place], [place] [address]    Special Requests BOTTLES DRAWN AEROBIC AND ANAEROBIC 4CC  Final   Culture PENDING  Incomplete   Report Status PENDING  Incomplete     Radiological Exams on Admission: Dg Chest 2 View  Result Date: 12/17/2016 CLINICAL DATA:  Cough, fever, shortness of breath. EXAM: CHEST  2 VIEW COMPARISON:  None. FINDINGS: The heart size and mediastinal contours are within normal limits. Both lungs are clear. No pneumothorax or pleural effusion is noted. The visualized skeletal structures are unremarkable. IMPRESSION: No active  cardiopulmonary disease. Electronically Signed   By: Marijo Conception, M.D.   On: 12/17/2016 12:20    EKG: Independently reviewed.   Assessment/Plan Principal Problem:   Acute bronchitis Active Problems:   Rheumatoid arthritis ([place])   History of leukemia   Essential hypertension, benign   Hypokalemia   Acute bronchitis -Presented with fever, chills, SOB, cough and sputum production. -CXR without pneumonia. -Started on IV antibiotics and increase prednisone to 60 mg daily. -Continue supportive management with bronchodilators, mucolytics, antitussives and oxygen as needed.  Rheumatoid arthritis -On multiple immunomodulators, continue leflunomide, prednisone and in help. -This is chronic and stable problem.  Leukemia -Patient follows with Dr. Baird Cancer at [facility], reported she is on remission. -Follow-up as outpatient.  HTN -Stable and chronic problem, continue home medications.  Hypokalemia -Acute, likely  secondary to diuresis, replete with oral supplements.   DVT prophylaxis: SQ Heparin Code Status: Full Code Family Communication: Plan D/W patient Disposition Plan: Home, likely in AM. Consults called:  Admission status: Obs   [facility] A MD [facility] Pager [phone]  If 7PM-7AM, please contact night-coverage [email or web address] Password [facility]  12/17/2016, 5:28 PM

## 2016-12-17 NOTE — ED Provider Notes (Signed)
[facility] DEPT [facility] Provider Note   CSN: [id number] Arrival date & time: 12/17/16  1039     History   Chief Complaint Chief Complaint  Patient presents with  . Cough  . Fever    HPI Jennifer Hernandez is a 61 y.o. female.  The history is provided by the patient and medical records.  Cough   Fever   Associated symptoms include cough.   61 y.o. F with hx of arthritis, HTN, leukemia getting weekly IgG, osteoporosis, seizures, presenting to the ED for cough and intermittent fevers x 3 weeks.  Patient reports symptoms have been progressively worsening for the past 3 weeks. States she is trying to get short of breath and has pain in her chest which radiates to her back. She was seen by her primary care doctor yesterday and given an inhaler and sent for an x-ray. She is not noticed any change in her symptoms.  States she feels weak.  Has had poor PO intake recently.  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  She also takes MTX as well as leflunomide.  States usually she does require admission, otherwise infection will not clear.   States she talked with her oncologist, Dr. Baird Cancer, this morning, she reports he wanted her to come in for possible admission.    Past Medical History:  Diagnosis Date  . Arthritis   . Hypertension   . Leukemia ([place])   . Osteoporosis   . Seizures ([place])    last seizure in 2012    There are no active problems to display for this patient.   Past Surgical History:  Procedure Laterality Date  . ABDOMINAL HYSTERECTOMY    . APPENDECTOMY    . arm surgery    . BACK SURGERY    . CESAREAN SECTION    . NECK SURGERY    . SHOULDER SURGERY      OB History    No data available       Home Medications    Prior to Admission medications   Medication Sig Start Date End Date Taking? Authorizing Provider  oxyCODONE-acetaminophen (PERCOCET/ROXICET) 5-325 MG tablet Take 1 tablet by mouth every 4 (four) hours as needed for severe pain.  08/01/16  Yes Anne Ng, PA-C    Family History No family history on file.  Social History Social History  Substance Use Topics  . Smoking status: Former Research scientist (life sciences)  . Smokeless tobacco: Never Used  . Alcohol use No     Allergies   Aspirin; Dimetapp cold-allergy [brompheniramine-phenylephrine]; Ibuprofen; and Penicillins   Review of Systems Review of Systems  Constitutional: Positive for fever.  Respiratory: Positive for cough.   All other systems reviewed and are negative.    Physical Exam Updated Vital Signs BP 128/82 (BP Location: Right Arm)   Pulse 104   Temp 98.2 F (36.8 C) (Oral)   Resp 22   Ht 5\' 4"  (1.626 m)   Wt 65.8 kg   SpO2 93%   BMI 24.89 kg/m   Physical Exam  Constitutional: She is oriented to person, place, and time. She appears well-developed and well-nourished.  HENT:  Head: Normocephalic and atraumatic.  Mouth/Throat: Oropharynx is clear and moist.  Eyes: Conjunctivae and EOM are normal. Pupils are equal, round, and reactive to light.  Neck: Normal range of motion.  Cardiovascular: Normal rate, regular rhythm and normal heart sounds.   Pulmonary/Chest: Effort normal. She has wheezes. She has rhonchi.  Lungs are extremely junky  with intermixed wheezes and rhonchi, labored breathing, speaking in shortened sentences  Abdominal: Soft. Bowel sounds are normal.  Musculoskeletal: Normal range of motion.  Neurological: She is alert and oriented to person, place, and time.  Skin: Skin is warm and dry.  Psychiatric: She has a normal mood and affect.  Nursing note and vitals reviewed.    ED Treatments / Results  Labs (all labs ordered are listed, but only abnormal results are displayed) Labs Reviewed - No data to display  EKG  EKG Interpretation None       Radiology Dg Chest 2 View  Result Date: 12/17/2016 CLINICAL DATA:  Cough, fever, shortness of breath. EXAM: CHEST  2 VIEW COMPARISON:  None. FINDINGS: The heart size and mediastinal  contours are within normal limits. Both lungs are clear. No pneumothorax or pleural effusion is noted. The visualized skeletal structures are unremarkable. IMPRESSION: No active cardiopulmonary disease. Electronically Signed   By: Marijo Conception, M.D.   On: 12/17/2016 12:20   Interface, Rad Results In - 12/16/2016 4:50 PM EST  CLINICAL DATA: Three months of cough with occasional flu-like symptoms including shortness of breath and chest pain and tightness. History of asthma and leukemia.  EXAM: CHEST 2 VIEW  COMPARISON: PA and lateral chest x-ray of September 23, 2016  FINDINGS: The lungs are well-expanded. There is hazy increased density at both lung bases. There is no discrete air bronchogram. The interstitial markings elsewhere in both lungs are coarse but stable. The heart and pulmonary vascularity are normal. There is calcification in the wall of the aortic arch. There is no pleural effusion. There is mild multilevel degenerative disc disease of the thoracic spine. There is gentle dextrocurvature centered in the lower thoracic spine which is stable.  IMPRESSION: Mild chronic bronchitic-reactive airway changes with superimposed early interstitial and pneumonia versus subsegmental atelectasis in the lower lobes. No CHF. Followup PA and lateral chest X-ray is recommended in 3-4 weeks following trial of antibiotic therapy to ensure resolution and exclude underlying malignancy.  Thoracic aortic atherosclerosis.   Electronically Signed By: David Martinique M.D. On: 12/16/2016 16:48  Procedures Procedures (including critical care time)  Angiocath insertion Performed by: Larene Pickett  Consent: Verbal consent obtained. Risks and benefits: risks, benefits and alternatives were discussed Time out: Immediately prior to procedure a "time out" was called to verify the correct patient, procedure, equipment, support staff and site/side marked as required.  Preparation: Patient was  prepped and draped in the usual sterile fashion.  Vein Location: right EJ  Not ultrasound guided-- landmarks visulaized  Gauge: 20  Normal blood return and flush without difficulty Patient tolerance: Patient tolerated the procedure well with no immediate complications.     Medications Ordered in ED Medications - No data to display   Initial Impression / Assessment and Plan / ED Course  I have reviewed the triage vital signs and the nursing notes.  Pertinent labs & imaging results that were available during my care of the patient were reviewed by me and considered in my medical decision making (see chart for details).  61 year old female here with cough, fever, and shortness of breath the past 3 weeks. She is afebrile on arrival here but does appear unwell. Her lungs are very junky with intermixed wheezes and rhonchi. Breathing is mildly labored, speaking in short sentences.  O2 sats borderline at 93% on room air. Patient does not generally require supplemental oxygen. Chest x-ray today read as clear, one performed yesterday revealed pneumonia which clinically  correlates.  Lactic acid mildly elevated with leukocytosis around 12K.  Mild hypokalemia which was replaced here. Patient is immunosuppressed, blood cultures were obtained.  After neb treatments and IV steroids here, breathing still labored, states she is not feeling any better currently.  Patient likely will need admission, patient agreeable to this.  Started Rocephin and azithromycin.   Discussed with hospitalist, Dr. Aggie Moats-- recommends add flu swab, admit to tele obs.  Temp admit orders placed.  EMTALA completed.  Final Clinical Impressions(s) / ED Diagnoses   Final diagnoses:  Community acquired pneumonia, unspecified laterality  Hypokalemia  Immunosuppression [facility])    New Prescriptions New Prescriptions   No medications on file     Larene Pickett, PA-C 12/17/16 [place], MD 12/17/16 [phone]

## 2016-12-17 NOTE — ED Triage Notes (Signed)
Cough x 3 weeks with intermittent fever. Hx of pneumonia

## 2016-12-18 DIAGNOSIS — Z856 Personal history of leukemia: Secondary | ICD-10-CM | POA: Diagnosis not present

## 2016-12-18 DIAGNOSIS — E876 Hypokalemia: Secondary | ICD-10-CM | POA: Diagnosis not present

## 2016-12-18 DIAGNOSIS — I10 Essential (primary) hypertension: Secondary | ICD-10-CM | POA: Diagnosis not present

## 2016-12-18 LAB — BASIC METABOLIC PANEL
Anion gap: 9 (ref 5–15)
BUN: 17 mg/dL (ref 6–20)
CALCIUM: 8.6 mg/dL — AB (ref 8.9–10.3)
CO2: 23 mmol/L (ref 22–32)
CREATININE: 0.48 mg/dL (ref 0.44–1.00)
Chloride: 106 mmol/L (ref 101–111)
GFR calc Af Amer: >60 mL/min (ref >60)
Glucose, Bld: 86 mg/dL (ref 65–99)
Potassium: 3.6 mmol/L (ref 3.5–5.1)
SODIUM: 138 mmol/L (ref 135–145)

## 2016-12-18 LAB — CBC
HCT: 34.9 % — ABNORMAL LOW (ref 36.0–46.0)
Hemoglobin: 11.4 g/dL — ABNORMAL LOW (ref 12.0–15.0)
MCH: 31.7 pg (ref 26.0–34.0)
MCHC: 32.7 g/dL (ref 30.0–36.0)
MCV: 96.9 fL (ref 78.0–100.0)
PLATELETS: 343 10*3/uL (ref 150–400)
RBC: 3.6 MIL/uL — ABNORMAL LOW (ref 3.87–5.11)
RDW: 16.3 % — AB (ref 11.5–15.5)
WBC: 11.2 10*3/uL — AB (ref 4.0–10.5)

## 2016-12-18 MED ORDER — LEVOFLOXACIN 750 MG PO TABS: 750.0000 mg | ORAL_TABLET | Freq: Every day | ORAL | 0 refills | Status: DC

## 2016-12-18 MED ORDER — IPRATROPIUM-ALBUTEROL 0.5-2.5 (3) MG/3ML IN SOLN
3.0000 mL | Freq: Three times a day (TID) | RESPIRATORY_TRACT | Status: DC
  Administered 2016-12-18: 3 mL via RESPIRATORY_TRACT
  Filled 2016-12-18 (×2): qty 3

## 2016-12-18 MED ORDER — PREDNISONE 5 MG PO TABS: 5.0000 mg | ORAL_TABLET | Freq: Every day | ORAL | Status: DC

## 2016-12-18 MED ORDER — GUAIFENESIN ER 600 MG PO TB12: 1200.0000 mg | ORAL_TABLET | Freq: Two times a day (BID) | ORAL | 0 refills | Status: DC

## 2016-12-18 MED ORDER — PREDNISONE 10 MG (21) PO TBPK: ORAL_TABLET | ORAL | 0 refills | Status: DC

## 2016-12-18 NOTE — Progress Notes (Signed)
  Discharge instructions given to patient and all questions answered.  Pt. Stated that medications were sent to wrong pharmacy, so after calling the pharmacy, they will be sent to the [facility] [place].

## 2016-12-18 NOTE — Brief Op Note (Signed)
Nutrition Brief Note:  RD was consulted for MST. Patient appeared well nourished and noted appetite was "good." Breakfast PO intake was 100%. Per patient report, she eats 3 meals at home and has had a steady appetite. No reports of nausea vomiting or abdominal pain. No weakness or fatigue reported. Patient reports weight has been steady for several months.  Juliann Pulse M.S. Nutrition Dietetic Intern

## 2016-12-18 NOTE — Discharge Summary (Signed)
Physician Discharge Summary  [name] [id number] DOB: 12-15-1955 DOA: 12/17/2016  PCP: Sinclair Ship, MD  Admit date: 12/17/2016 Discharge date: 12/18/2016  Admitted From: Home Disposition: Home  Recommendations for Outpatient Follow-up:  1. Follow up with PCP in 1-2 weeks 2. Please obtain BMP/CBC in one week   Home Health: NA Equipment/Devices:NA  Discharge Condition: Stable CODE STATUS: Full Code Diet recommendation: Diet Heart Room service appropriate? Yes; Fluid consistency: Thin Diet - low sodium heart healthy  Brief/Interim Summary: Jennifer Hernandez is a 61 y.o. female with medical history significant of leukemia, IgG deficiency, has history of rheumatoid arthritis on immunomodulators, patient came into the hospital complaining about subacute cough. Patient reported that before Christmas she had cough that comes and goes, she went to see her primary care physician twice for this. For the past 2-3 days she reported fever of 101 at home, cough is productive of some phlegm which described as yellow. She denies any other complaints, denies any chest pain denies any palpitations.  Lactic acid was 1.91, right on the edge, patient does not look septic this could be secondary to dehydration.  Discharge Diagnoses:  Principal Problem:   Acute bronchitis Active Problems:   Rheumatoid arthritis ([place])   History of leukemia   Essential hypertension, benign   Hypokalemia   Acute bronchitis -Presented with fever (reported fever of 101 at home, no fever here), chills, SOB, cough and sputum production. -CXR without pneumonia. -Started on antibiotics and prednisone to 60 mg daily. -Treated with supportive management with bronchodilators, mucolytics, antitussives and oxygen as needed. -Discharged home on levofloxacin for 6 more days, Mucinex and prednisone taper.  Rheumatoid arthritis -On multiple immunomodulators, continue leflunomide, prednisone and Enbrel. -This is chronic and  stable problem. -Prednisone change to a taper start 60 for 7 days, then go back to her regular dose of 5 mg.  Leukemia -Patient follows with Dr. Baird [facility] at [facility], reported she is on remission. -Follow-up as outpatient.  HTN -Stable and chronic problem, continue home medications.  Hypokalemia -Acute, likely secondary to diuresis, repleted with oral supplements. Admitted with potassium 3.2, discharge potassium 3.6.  Multiple allergies -She has multiple allergies to different medications please see below.  Discharge Instructions  Discharge Instructions    Diet - low sodium heart healthy    Complete by:  As directed    Increase activity slowly    Complete by:  As directed      Allergies as of 12/18/2016      Reactions   Aspirin Shortness Of Breath   Brompheniramine Anaphylaxis   Dimetapp Cold-allergy [brompheniramine-phenylephrine] Shortness Of Breath   Ibuprofen Shortness Of Breath   Penicillins Shortness Of Breath   Tolmetin Anaphylaxis   Doxycycline Nausea And Vomiting   Penicillin G Rash      Medication List    STOP taking these medications   oxyCODONE-acetaminophen 5-325 MG tablet Commonly known as:  PERCOCET/ROXICET     TAKE these medications   albuterol 108 (90 Base) MCG/ACT inhaler Commonly known as:  PROVENTIL HFA;VENTOLIN HFA Inhale 2 puffs into the lungs every 4 (four) hours as needed for wheezing or shortness of breath.   amLODipine 5 MG tablet Commonly known as:  NORVASC Take 5 mg by mouth daily.   busPIRone 10 MG tablet Commonly known as:  BUSPAR Take 40 mg by mouth 2 (two) times daily.   CALCIUM 1000 + D 1000-800 MG-UNIT Tabs Generic drug:  Calcium Carb-Cholecalciferol Take 1 tablet by mouth daily.   citalopram 20 MG  tablet Commonly known as:  CELEXA Take 20 mg by mouth daily.   diazepam 10 MG tablet Commonly known as:  VALIUM Take 10 mg by mouth every 6 (six) hours as needed for anxiety or sleep.   esomeprazole 20 MG  capsule Commonly known as:  NEXIUM Take 40 mg by mouth 2 (two) times daily before a meal.   estrogens (conjugated) 1.25 MG tablet Commonly known as:  PREMARIN Take 1.25 mg by mouth daily.   Etanercept 50 MG/ML Soct Inject 50 mg into the skin once a week.   fexofenadine 60 MG tablet Commonly known as:  ALLEGRA Take 60 mg by mouth 2 (two) times daily.   fluticasone 50 MCG/ACT nasal spray Commonly known as:  FLONASE Place 1 spray into both nostrils daily.   Fluticasone-Salmeterol 500-50 MCG/DOSE Aepb Commonly known as:  ADVAIR Inhale 1 puff into the lungs 2 (two) times daily.   folic acid 1 MG tablet Commonly known as:  FOLVITE Take 1 mg by mouth daily.   gabapentin 300 MG capsule Commonly known as:  NEURONTIN Take 600 mg by mouth at bedtime.   guaiFENesin 600 MG 12 hr tablet Commonly known as:  MUCINEX Take 2 tablets (1,200 mg total) by mouth 2 (two) times daily.   leflunomide 10 MG tablet Commonly known as:  ARAVA Take 10 mg by mouth daily.   levofloxacin 750 MG tablet Commonly known as:  LEVAQUIN Take 1 tablet (750 mg total) by mouth daily. Start taking on:  12/19/2016   methocarbamol 750 MG tablet Commonly known as:  ROBAXIN Take 750 mg by mouth 4 (four) times daily.   methotrexate 2.5 MG tablet Take 15 mg by mouth once a week.   mirabegron ER 25 MG Tb24 tablet Commonly known as:  MYRBETRIQ Take 25 mg by mouth 2 (two) times daily.   potassium chloride 10 MEQ tablet Commonly known as:  K-DUR,KLOR-CON Take 10 mEq by mouth 2 (two) times daily.   pramipexole 0.25 MG tablet Commonly known as:  MIRAPEX Take 0.25 mg by mouth 3 (three) times daily.   predniSONE 10 MG (21) Tbpk tablet Commonly known as:  STERAPRED UNI-PAK 21 TAB Take 6-5-4-3-2-1 PO orally till gone What changed:  You were already taking a medication with the same name, and this prescription was added. Make sure you understand how and when to take each.   predniSONE 5 MG tablet Commonly known  as:  DELTASONE Take 1 tablet (5 mg total) by mouth daily with breakfast. What changed:  when to take this   QUEtiapine 400 MG tablet Commonly known as:  SEROQUEL Take 400 mg by mouth at bedtime.   senna-docusate 8.6-50 MG tablet Commonly known as:  Senokot-S Take 2 tablets by mouth 2 (two) times daily.   spironolactone-hydrochlorothiazide 25-25 MG tablet Commonly known as:  ALDACTAZIDE Take 1 tablet by mouth daily.   traMADol 50 MG tablet Commonly known as:  ULTRAM Take 100 mg by mouth 3 (three) times daily.      Follow-up Information    Sinclair Ship, MD.   Specialty:  Internal Medicine Why:  They will call the patient to make the appointment. Contact information: [id number] [place] [address] [phone]          Allergies  Allergen Reactions  . Aspirin Shortness Of Breath  . Brompheniramine Anaphylaxis  . Dimetapp Cold-Allergy [Brompheniramine-Phenylephrine] Shortness Of Breath  . Ibuprofen Shortness Of Breath  . Penicillins Shortness Of Breath  . Tolmetin Anaphylaxis  .  Doxycycline Nausea And Vomiting  . Penicillin G Rash    Consultations:  None  Procedures (Echo, Carotid, EGD, Colonoscopy, ERCP)   Radiological studies: Dg Chest 2 View  Result Date: 12/17/2016 CLINICAL DATA:  Cough, fever, shortness of breath. EXAM: CHEST  2 VIEW COMPARISON:  None. FINDINGS: The heart size and mediastinal contours are within normal limits. Both lungs are clear. No pneumothorax or pleural effusion is noted. The visualized skeletal structures are unremarkable. IMPRESSION: No active cardiopulmonary disease. Electronically Signed   By: Marijo Conception, M.D.   On: 12/17/2016 12:20    Subjective:  Discharge Exam: Vitals:   12/17/16 2154 12/18/16 0048 12/18/16 0638 12/18/16 0755  BP: 124/88 123/75 113/71   Pulse: 93 85 73   Resp: (!) 22  20   Temp: 98.1 F (36.7 C) 98.2 F (36.8 C) 98.2 F (36.8 C)   TempSrc: Oral Oral Oral   SpO2: 93% 95%  95% 96%  Weight:   71.7 kg (158 lb 1.6 oz)   Height:       General: Pt is alert, awake, not in acute distress Cardiovascular: RRR, S1/S2 +, no rubs, no gallops Respiratory: CTA bilaterally, no wheezing, no rhonchi Abdominal: Soft, NT, ND, bowel sounds + Extremities: no edema, no cyanosis   The results of significant diagnostics from this hospitalization (including imaging, microbiology, ancillary and laboratory) are listed below for reference.    Microbiology: Recent Results (from the past 240 hour(s))  Blood culture (routine x 2)     Status: None (Preliminary result)   Collection Time: 12/17/16 12:00 PM  Result Value Ref Range Status   Specimen Description   Final    BLOOD LEFT ARM Performed at [facility], [address] [place], [place] [address]    Special Requests BOTTLES DRAWN AEROBIC AND ANAEROBIC 4CC  Final   Culture PENDING  Incomplete   Report Status PENDING  Incomplete     Labs: BNP (last 3 results) No results for input(s): BNP in the last 8760 hours. Basic Metabolic Panel:  Recent Labs Lab 12/17/16 1135 12/18/16 0608  NA 138 138  K 3.2* 3.6  CL 104 106  CO2 26 23  GLUCOSE 95 86  BUN 17 17  CREATININE 0.41* 0.48  CALCIUM 8.9 8.6*   Liver Function Tests: No results for input(s): AST, ALT, ALKPHOS, BILITOT, PROT, ALBUMIN in the last 168 hours. No results for input(s): LIPASE, AMYLASE in the last 168 hours. No results for input(s): AMMONIA in the last 168 hours. CBC:  Recent Labs Lab 12/17/16 1135 12/18/16 0608  WBC 12.0* 11.2*  NEUTROABS 10.9*  --   HGB 12.1 11.4*  HCT 35.8* 34.9*  MCV 95.7 96.9  PLT 338 343   Cardiac Enzymes:  Recent Labs Lab 12/17/16 1135  TROPONINI <0.03   BNP: Invalid input(s): POCBNP CBG: No results for input(s): GLUCAP in the last 168 hours. D-Dimer No results for input(s): DDIMER in the last 72 hours. Hgb A1c No results for input(s): HGBA1C in the last 72 hours. Lipid Profile No results for  input(s): CHOL, HDL, LDLCALC, TRIG, CHOLHDL, LDLDIRECT in the last 72 hours. Thyroid function studies  Recent Labs  12/17/16 1823  TSH 0.112*   Anemia work up No results for input(s): VITAMINB12, FOLATE, FERRITIN, TIBC, IRON, RETICCTPCT in the last 72 hours. Urinalysis    Component Value Date/Time   COLORURINE YELLOW 12/17/2016 1225   APPEARANCEUR CLEAR 12/17/2016 1225   LABSPEC 1.012 12/17/2016 1225   PHURINE 6.5 12/17/2016  [place] 12/17/2016 [place] 12/17/2016 [place] 12/17/2016 [place] 12/17/2016 1225   PROTEINUR NEGATIVE 12/17/2016 1225   NITRITE NEGATIVE 12/17/2016 1225   LEUKOCYTESUR NEGATIVE 12/17/2016 1225   Sepsis Labs Invalid input(s): PROCALCITONIN,  WBC,  LACTICIDVEN Microbiology Recent Results (from the past 240 hour(s))  Blood culture (routine x 2)     Status: None (Preliminary result)   Collection Time: 12/17/16 12:00 PM  Result Value Ref Range Status   Specimen Description   Final    BLOOD LEFT ARM Performed at [facility], [place] [address] [place], [place] [address]    Special Requests BOTTLES DRAWN AEROBIC AND ANAEROBIC 4CC  Final   Culture PENDING  Incomplete   Report Status PENDING  Incomplete     Time coordinating discharge: Over 30 minutes  SIGNED:   Birdie Hopes, MD  [facility] 12/18/2016, 10:54 AM Pager   If 7PM-7AM, please contact night-coverage [email or web address] Password [email or web address]

## 2016-12-22 LAB — CULTURE, BLOOD (ROUTINE X 2)
CULTURE: NO GROWTH
Culture: NO GROWTH

## 2017-04-13 ENCOUNTER — Emergency Department (HOSPITAL_BASED_OUTPATIENT_CLINIC_OR_DEPARTMENT_OTHER)
Admission: EM | Admit: 2017-04-13 | Discharge: 2017-04-13 | Disposition: A | Discharge status: Home / Self Care | Payer: Medicaid Other | Attending: Emergency Medicine | Admitting: Emergency Medicine

## 2017-04-13 ENCOUNTER — Emergency Department (HOSPITAL_BASED_OUTPATIENT_CLINIC_OR_DEPARTMENT_OTHER): Payer: Medicaid Other

## 2017-04-13 ENCOUNTER — Encounter (HOSPITAL_BASED_OUTPATIENT_CLINIC_OR_DEPARTMENT_OTHER): Payer: Self-pay

## 2017-04-13 DIAGNOSIS — L03115 Cellulitis of right lower limb: Secondary | ICD-10-CM | POA: Diagnosis not present

## 2017-04-13 DIAGNOSIS — M179 Osteoarthritis of knee, unspecified: Secondary | ICD-10-CM | POA: Insufficient documentation

## 2017-04-13 DIAGNOSIS — I10 Essential (primary) hypertension: Secondary | ICD-10-CM | POA: Diagnosis not present

## 2017-04-13 DIAGNOSIS — Z87891 Personal history of nicotine dependence: Secondary | ICD-10-CM | POA: Insufficient documentation

## 2017-04-13 DIAGNOSIS — Z79899 Other long term (current) drug therapy: Secondary | ICD-10-CM | POA: Insufficient documentation

## 2017-04-13 DIAGNOSIS — M25561 Pain in right knee: Secondary | ICD-10-CM | POA: Diagnosis not present

## 2017-04-13 DIAGNOSIS — M1711 Unilateral primary osteoarthritis, right knee: Secondary | ICD-10-CM

## 2017-04-13 MED ORDER — OXYCODONE-ACETAMINOPHEN 5-325 MG PO TABS
1.0000 | ORAL_TABLET | Freq: Once | ORAL | Status: AC
  Administered 2017-04-13: 1 via ORAL
  Filled 2017-04-13: qty 1

## 2017-04-13 MED ORDER — LIDOCAINE-EPINEPHRINE 2 %-1:100000 IJ SOLN
20.0000 mL | Freq: Once | INTRAMUSCULAR | Status: AC
  Administered 2017-04-13: 20 mL
  Filled 2017-04-13: qty 1

## 2017-04-13 MED ORDER — CLINDAMYCIN HCL 150 MG PO CAPS
300.0000 mg | ORAL_CAPSULE | Freq: Once | ORAL | Status: AC
  Administered 2017-04-13: 300 mg via ORAL
  Filled 2017-04-13: qty 2

## 2017-04-13 MED ORDER — CLINDAMYCIN HCL 300 MG PO CAPS: 300.0000 mg | ORAL_CAPSULE | Freq: Three times a day (TID) | ORAL | 0 refills | Status: DC

## 2017-04-13 NOTE — ED Notes (Signed)
ED Provider at bedside. 

## 2017-04-13 NOTE — ED Provider Notes (Signed)
[facility] DEPT [facility] Provider Note   CSN: [id number] Arrival date & time: 04/13/17  1539     History   Chief Complaint Chief Complaint  Patient presents with  . Knee Pain    HPI Jennifer Hernandez is a 61 y.o. female.  HPI Patient presents the emergency para 2 complaints.  First complaint is right knee pain over the past week after tripping and falling.  She states swelling and pain in her right knee.  She's been ambulatory since the fall.  She also reports chronic wound to her right lower extremity that began 3 weeks ago and is yet to heal.  She's concerned about the possibility of underlying infection and feels like his been a small amount of drainage and requests incision and drainage of the right lower extremity chronic wound.  No fevers or chills.  No spreading redness.   Past Medical History:  Diagnosis Date  . Arthritis   . Hypertension   . Leukemia ([place])   . Osteoporosis   . RA (rheumatoid arthritis) ([place])   . Seizures ([place])    last seizure in 2012    Patient Active Problem List   Diagnosis Date Noted  . Acute bronchitis 12/17/2016  . Rheumatoid arthritis ([place]) 12/17/2016  . History of leukemia 12/17/2016  . Essential hypertension, benign 12/17/2016  . Hypokalemia 12/17/2016    Past Surgical History:  Procedure Laterality Date  . ABDOMINAL HYSTERECTOMY    . APPENDECTOMY    . arm surgery    . BACK SURGERY    . CESAREAN SECTION    . NECK SURGERY    . SHOULDER SURGERY      OB History    No data available       Home Medications    Prior to Admission medications   Medication Sig Start Date End Date Taking? Authorizing Provider  albuterol (PROVENTIL HFA;VENTOLIN HFA) 108 (90 Base) MCG/ACT inhaler Inhale 2 puffs into the lungs every 4 (four) hours as needed for wheezing or shortness of breath.    [provider]  amLODipine (NORVASC) 5 MG tablet Take 5 mg by mouth daily.    [provider]  busPIRone (BUSPAR) 10 MG tablet Take 40 mg  by mouth 2 (two) times daily.     [provider]  Calcium Carb-Cholecalciferol (CALCIUM 1000 + D) 1000-800 MG-UNIT TABS Take 1 tablet by mouth daily.    [provider]  citalopram (CELEXA) 20 MG tablet Take 20 mg by mouth daily.    [provider]  clindamycin (CLEOCIN) 300 MG capsule Take 1 capsule (300 mg total) by mouth 3 (three) times daily. 04/13/17   Jola Schmidt, MD  diazepam (VALIUM) 10 MG tablet Take 10 mg by mouth every 6 (six) hours as needed for anxiety or sleep.    [provider]  esomeprazole (NEXIUM) 20 MG capsule Take 40 mg by mouth 2 (two) times daily before a meal.     [provider]  estrogens, conjugated, (PREMARIN) 1.25 MG tablet Take 1.25 mg by mouth daily.    [provider]  Etanercept 50 MG/ML SOCT Inject 50 mg into the skin once a week.    [provider]  fexofenadine (ALLEGRA) 60 MG tablet Take 60 mg by mouth 2 (two) times daily.    [provider]  fluticasone (FLONASE) 50 MCG/ACT nasal spray Place 1 spray into both nostrils daily.    [provider]  Fluticasone-Salmeterol (ADVAIR) 500-50 MCG/DOSE AEPB Inhale 1 puff into the  lungs 2 (two) times daily.    [provider]  folic acid (FOLVITE) 1 MG tablet Take 1 mg by mouth daily.    [provider]  gabapentin (NEURONTIN) 300 MG capsule Take 600 mg by mouth at bedtime.    [provider]  guaiFENesin (MUCINEX) 600 MG 12 hr tablet Take 2 tablets (1,200 mg total) by mouth 2 (two) times daily. 12/18/16   Verlee Monte, MD  leflunomide (ARAVA) 10 MG tablet Take 10 mg by mouth daily.    [provider]  levofloxacin (LEVAQUIN) 750 MG tablet Take 1 tablet (750 mg total) by mouth daily. 12/19/16   Verlee Monte, MD  methocarbamol (ROBAXIN) 750 MG tablet Take 750 mg by mouth 4 (four) times daily.    [provider]  methotrexate 2.5 MG tablet Take 15 mg by mouth once a week.    [provider]    mirabegron ER (MYRBETRIQ) 25 MG TB24 tablet Take 25 mg by mouth 2 (two) times daily.    [provider]  potassium chloride (K-DUR,KLOR-CON) 10 MEQ tablet Take 10 mEq by mouth 2 (two) times daily.    [provider]  pramipexole (MIRAPEX) 0.25 MG tablet Take 0.25 mg by mouth 3 (three) times daily.    [provider]  predniSONE (DELTASONE) 5 MG tablet Take 1 tablet (5 mg total) by mouth daily with breakfast. 12/18/16   Verlee Monte, MD  predniSONE (STERAPRED UNI-PAK 21 TAB) 10 MG (21) TBPK tablet Take 6-5-4-3-2-1 PO orally till gone 12/18/16   Verlee Monte, MD  QUEtiapine (SEROQUEL) 400 MG tablet Take 400 mg by mouth at bedtime.    [provider]  senna-docusate (SENOKOT-S) 8.6-50 MG tablet Take 2 tablets by mouth 2 (two) times daily.    [provider]  spironolactone-hydrochlorothiazide (ALDACTAZIDE) 25-25 MG tablet Take 1 tablet by mouth daily.    [provider]  traMADol (ULTRAM) 50 MG tablet Take 100 mg by mouth 3 (three) times daily.    [provider]    Family History No family history on file.  Social History Social History  Substance Use Topics  . Smoking status: Former Research scientist (life sciences)  . Smokeless tobacco: Never Used  . Alcohol use No     Allergies   Aspirin; Brompheniramine; Dimetapp cold-allergy [brompheniramine-phenylephrine]; Ibuprofen; Penicillins; Tolmetin; Doxycycline; and Penicillin g   Review of Systems Review of Systems  All other systems reviewed and are negative.    Physical Exam Updated Vital Signs BP (!) 143/87 (BP Location: Left Arm)   Pulse (!) 111   Temp 98.4 F (36.9 C) (Oral)   Resp 20   Ht 5\' 4"  (1.626 m)   Wt 76.7 kg (169 lb)   SpO2 97%   BMI 29.01 kg/m   Physical Exam  Constitutional: She is oriented to person, place, and time. She appears well-developed and well-nourished.  HENT:  Head: Normocephalic.  Eyes: EOM are normal.  Neck: Normal range of motion.  Pulmonary/Chest:  Effort normal.  Abdominal: She exhibits no distension.  Musculoskeletal: Normal range of motion.  Mild pain with range of motion the right knee.  No significant joint effusion.  Chronic wound to the right proximal anteromedial tibial region with a small amount of surrounding erythema.  No frank fluctuance.  No wound discharge/drainage  Neurological: She is alert and oriented to person, place, and time.  Psychiatric: She has a normal mood and affect.  Nursing note and vitals reviewed.    ED Treatments / Results  Labs (all labs ordered are listed, but only abnormal results are displayed) Labs Reviewed - No data to display  EKG  EKG Interpretation None       Radiology Dg Knee Complete 4 Views Right  Result Date: 04/13/2017 CLINICAL DATA:  Patient fell several weeks ago and has persistent pain. Walks with a limp favoring the right knee. EXAM: RIGHT KNEE - COMPLETE 4+ VIEW COMPARISON:  08/01/2016 FINDINGS: Tricompartmental osteoarthritis of the knee with bone-on-bone apposition of the medial femorotibial compartment is again noted. No acute fracture nor dislocation. No appreciable joint effusion nor intra-articular loose bodies. IMPRESSION: Stable tricompartmental osteoarthritis without acute fracture or malalignment. Electronically Signed   By: Ashley Royalty M.D.   On: 04/13/2017 16:18    Procedures .Marland KitchenIncision and Drainage Performed by: Jola Schmidt Authorized by: Jola Schmidt     Consent: Verbal consent obtained. Risks and benefits: risks, benefits and alternatives were discussed Time out performed prior to procedure Type: abscess Body area: Right proximal tibia Anesthesia: local infiltration Incision was made with a scalpel. Local anesthetic: lidocaine 2 % with epinephrine Anesthetic total: 10 ml Complexity: complex Blunt dissection to break up loculations Drainage: None  Drainage amount: None  Packing material: None  Patient tolerance: Patient tolerated the procedure  well with no immediate complications.     Medications Ordered in ED Medications  lidocaine-EPINEPHrine (XYLOCAINE W/EPI) 2 %-1:100000 (with pres) injection 20 mL (20 mLs Infiltration Given by Other 04/13/17 1654)  clindamycin (CLEOCIN) capsule 300 mg (300 mg Oral Given 04/13/17 1654)  oxyCODONE-acetaminophen (PERCOCET/ROXICET) 5-325 MG per tablet 1 tablet (1 tablet Oral Given 04/13/17 1655)     Initial Impression / Assessment and Plan / ED Course  I have reviewed the triage vital signs and the nursing notes.  Pertinent labs & imaging results that were available during my care of the patient were reviewed by me and considered in my medical decision making (see chart for details).     Patient requests incision and drainage.  This was performed for the possibility of underlying abscess.  No abscess encountered.  Patient be placed on clindamycin.  In regards to her right knee this is likely arthritic flare.  Orthopedic follow-up.  She may benefit from MRI as an outpatient to further evaluate for ligamentous injury.  Final Clinical Impressions(s) / ED Diagnoses   Final diagnoses:  Acute pain of right knee  Cellulitis of right lower extremity without foot  Tricompartment osteoarthritis of right knee    New Prescriptions New Prescriptions   CLINDAMYCIN (CLEOCIN) 300 MG CAPSULE    Take 1 capsule (300 mg total) by mouth 3 (three) times daily.     Jola Schmidt, MD 04/13/17 [phone]

## 2017-04-13 NOTE — ED Triage Notes (Addendum)
C/o pain to right knee after a fall approx 2-3 weeks ago-also c/o injured right LE with wound noted-2 separate injuries-c/o redness, swelling to LE below wound site-NAD-steady gait

## 2017-04-13 NOTE — Discharge Instructions (Signed)
Please call your orthopedic surgeon for follow-up.  Please return to the emergency department for any new or worsening symptoms

## 2017-08-30 ENCOUNTER — Emergency Department (HOSPITAL_BASED_OUTPATIENT_CLINIC_OR_DEPARTMENT_OTHER)
Admission: EM | Admit: 2017-08-30 | Discharge: 2017-08-30 | Disposition: A | Discharge status: Home / Self Care | Payer: Medicaid Other | Attending: Emergency Medicine | Admitting: Emergency Medicine

## 2017-08-30 ENCOUNTER — Encounter (HOSPITAL_BASED_OUTPATIENT_CLINIC_OR_DEPARTMENT_OTHER): Payer: Self-pay | Admitting: Emergency Medicine

## 2017-08-30 DIAGNOSIS — I10 Essential (primary) hypertension: Secondary | ICD-10-CM | POA: Insufficient documentation

## 2017-08-30 DIAGNOSIS — Z87891 Personal history of nicotine dependence: Secondary | ICD-10-CM | POA: Diagnosis not present

## 2017-08-30 DIAGNOSIS — Z79899 Other long term (current) drug therapy: Secondary | ICD-10-CM | POA: Diagnosis not present

## 2017-08-30 DIAGNOSIS — M109 Gout, unspecified: Secondary | ICD-10-CM | POA: Diagnosis not present

## 2017-08-30 DIAGNOSIS — M11232 Other chondrocalcinosis, left wrist: Secondary | ICD-10-CM

## 2017-08-30 DIAGNOSIS — M25532 Pain in left wrist: Secondary | ICD-10-CM | POA: Diagnosis present

## 2017-08-30 LAB — SEDIMENTATION RATE: SED RATE: 38 mm/h — AB (ref 0–22)

## 2017-08-30 LAB — CBC
HCT: 35.4 % — ABNORMAL LOW (ref 36.0–46.0)
Hemoglobin: 11.3 g/dL — ABNORMAL LOW (ref 12.0–15.0)
MCH: 29.7 pg (ref 26.0–34.0)
MCHC: 31.9 g/dL (ref 30.0–36.0)
MCV: 93.2 fL (ref 78.0–100.0)
PLATELETS: 339 10*3/uL (ref 150–400)
RBC: 3.8 MIL/uL — ABNORMAL LOW (ref 3.87–5.11)
RDW: 15.5 % (ref 11.5–15.5)
WBC: 7.9 10*3/uL (ref 4.0–10.5)

## 2017-08-30 LAB — BASIC METABOLIC PANEL
Anion gap: 9 (ref 5–15)
BUN: 7 mg/dL (ref 6–20)
CALCIUM: 8.8 mg/dL — AB (ref 8.9–10.3)
CO2: 25 mmol/L (ref 22–32)
CREATININE: 0.55 mg/dL (ref 0.44–1.00)
Chloride: 100 mmol/L — ABNORMAL LOW (ref 101–111)
GFR calc Af Amer: >60 mL/min (ref >60)
GLUCOSE: 86 mg/dL (ref 65–99)
Potassium: 3.7 mmol/L (ref 3.5–5.1)
SODIUM: 134 mmol/L — AB (ref 135–145)

## 2017-08-30 LAB — SYNOVIAL CELL COUNT + DIFF, W/ CRYSTALS
Eosinophils-Synovial: 0 % (ref 0–1)
Lymphocytes-Synovial Fld: 3 % (ref 0–20)
Monocyte-Macrophage-Synovial Fluid: 13 % — ABNORMAL LOW (ref 50–90)
Neutrophil, Synovial: 84 % — ABNORMAL HIGH (ref 0–25)
Other Cells-SYN: 0
WBC, Synovial: 43500 /mm3 — ABNORMAL HIGH (ref 0–200)

## 2017-08-30 LAB — C-REACTIVE PROTEIN: CRP: 14.9 mg/dL — ABNORMAL HIGH (ref <1.0)

## 2017-08-30 MED ORDER — OXYCODONE-ACETAMINOPHEN 5-325 MG PO TABS
2.0000 | ORAL_TABLET | Freq: Once | ORAL | Status: AC
  Administered 2017-08-30: 2 via ORAL
  Filled 2017-08-30: qty 2

## 2017-08-30 MED ORDER — HYDROMORPHONE HCL 1 MG/ML IJ SOLN
1.0000 mg | Freq: Once | INTRAMUSCULAR | Status: AC
  Administered 2017-08-30: 1 mg via INTRAVENOUS
  Filled 2017-08-30: qty 1

## 2017-08-30 MED ORDER — FENTANYL CITRATE (PF) 100 MCG/2ML IJ SOLN
50.0000 ug | Freq: Once | INTRAMUSCULAR | Status: AC
  Administered 2017-08-30: 50 ug via INTRAVENOUS
  Filled 2017-08-30: qty 2

## 2017-08-30 MED ORDER — PREDNISONE 10 MG PO TABS: 40.0000 mg | ORAL_TABLET | Freq: Every day | ORAL | 0 refills | Status: AC

## 2017-08-30 MED ORDER — OXYCODONE-ACETAMINOPHEN 5-325 MG PO TABS: 2.0000 | ORAL_TABLET | Freq: Three times a day (TID) | ORAL | 0 refills | Status: AC | PRN

## 2017-08-30 MED ORDER — HYDROMORPHONE HCL 1 MG/ML IJ SOLN
1.0000 mg | Freq: Once | INTRAMUSCULAR | Status: AC
Start: 2017-08-30 — End: 2017-08-30
  Administered 2017-08-30: 1 mg via INTRAVENOUS
  Filled 2017-08-30: qty 1

## 2017-08-30 MED ORDER — LIDOCAINE HCL (PF) 1 % IJ SOLN
5.0000 mL | Freq: Once | INTRAMUSCULAR | Status: AC
  Administered 2017-08-30: 5 mL via INTRADERMAL

## 2017-08-30 MED ORDER — ONDANSETRON 4 MG PO TBDP
4.0000 mg | ORAL_TABLET | Freq: Once | ORAL | Status: DC
  Filled 2017-08-30: qty 1

## 2017-08-30 MED ORDER — PREDNISONE 50 MG PO TABS
60.0000 mg | ORAL_TABLET | Freq: Once | ORAL | Status: AC
  Administered 2017-08-30: 60 mg via ORAL
  Filled 2017-08-30: qty 1

## 2017-08-30 MED ORDER — OXYCODONE-ACETAMINOPHEN 5-325 MG PO TABS
1.0000 | ORAL_TABLET | Freq: Once | ORAL | Status: DC
  Filled 2017-08-30: qty 1

## 2017-08-30 MED FILL — OXYCODONE-ACETAMINOPHEN 5-3: 5-325 | 2 days supply | Qty: 10 | Fill #0

## 2017-08-30 MED FILL — predniSONE 10 MG TABS: 10 | 4 days supply | Qty: 16 | Fill #0

## 2017-08-30 NOTE — ED Provider Notes (Signed)
[facility] Provider Note   CSN: [id number] Arrival date & time: 08/30/17  7564     History   Chief Complaint Chief Complaint  Patient presents with  . Wrist Pain    HPI Jennifer Hernandez is a 61 y.o. female with history of RA, osteoporosis HTN who presents today with chief complaint acute onset, progressively worsening left wrist pain for 3 days.she states that on Saturday she began developing severe sharp, throbbing wrist pain which radiates to her fingertips and up to the middle of the forearm. Endorses intermittent tingling but denies numbness or weakness. She endorses redness and swelling of the hand and wrist. Pain worsens with movement and palpation. States pain has been so severe she has had persistent nausea and a few episodes of vomiting. Denies fevers, chills, night sweats, IV drug use. States this feels differently than her usual RA flares as it is more severe. She has tried ice and heat without significant relief. She did scratch the skin overlying her wrist on Friday the day before her symptoms began but states "that's been there for a while and I don't think it's related". Denies any recent trauma or falls.  The history is provided by the patient.    Past Medical History:  Diagnosis Date  . Arthritis   . Hypertension   . Leukemia ([place])   . Osteoporosis   . RA (rheumatoid arthritis) ([place])   . Seizures ([place])    last seizure in 2012    Patient Active Problem List   Diagnosis Date Noted  . Acute bronchitis 12/17/2016  . Rheumatoid arthritis ([place]) 12/17/2016  . History of leukemia 12/17/2016  . Essential hypertension, benign 12/17/2016  . Hypokalemia 12/17/2016    Past Surgical History:  Procedure Laterality Date  . ABDOMINAL HYSTERECTOMY    . APPENDECTOMY    . arm surgery    . BACK SURGERY    . CESAREAN SECTION    . NECK SURGERY    . SHOULDER SURGERY      OB History    No data available       Home Medications    Prior to Admission  medications   Medication Sig Start Date End Date Taking? Authorizing Provider  albuterol (PROVENTIL HFA;VENTOLIN HFA) 108 (90 Base) MCG/ACT inhaler Inhale 2 puffs into the lungs every 4 (four) hours as needed for wheezing or shortness of breath.    [provider]  amLODipine (NORVASC) 5 MG tablet Take 5 mg by mouth daily.    [provider]  busPIRone (BUSPAR) 10 MG tablet Take 40 mg by mouth 2 (two) times daily.     [provider]  Calcium Carb-Cholecalciferol (CALCIUM 1000 + D) 1000-800 MG-UNIT TABS Take 1 tablet by mouth daily.    [provider]  citalopram (CELEXA) 20 MG tablet Take 20 mg by mouth daily.    [provider]  diazepam (VALIUM) 10 MG tablet Take 10 mg by mouth every 6 (six) hours as needed for anxiety or sleep.    [provider]  esomeprazole (NEXIUM) 20 MG capsule Take 40 mg by mouth 2 (two) times daily before a meal.     [provider]  estrogens, conjugated, (PREMARIN) 1.25 MG tablet Take 1.25 mg by mouth daily.    [provider]  fexofenadine (ALLEGRA) 60 MG tablet Take 60 mg by mouth 2 (two) times daily.    [provider]  fluticasone (FLONASE) 50 MCG/ACT nasal spray Place 1 spray into both nostrils  daily.    [provider]  Fluticasone-Salmeterol (ADVAIR) 500-50 MCG/DOSE AEPB Inhale 1 puff into the lungs 2 (two) times daily.    [provider]  folic acid (FOLVITE) 1 MG tablet Take 1 mg by mouth daily.    [provider]  gabapentin (NEURONTIN) 300 MG capsule Take 600 mg by mouth at bedtime.    [provider]  leflunomide (ARAVA) 10 MG tablet Take 10 mg by mouth daily.    [provider]  methocarbamol (ROBAXIN) 750 MG tablet Take 750 mg by mouth 4 (four) times daily.    [provider]  methotrexate 2.5 MG tablet Take 15 mg by mouth once a week.    [provider]  mirabegron ER (MYRBETRIQ) 25 MG TB24 tablet Take 25 mg by  mouth 2 (two) times daily.    [provider]  oxyCODONE-acetaminophen (PERCOCET/ROXICET) 5-325 MG tablet Take 2 tablets by mouth every 8 (eight) hours as needed for severe pain. 08/30/17   Jesyka Slaght A, PA-C  potassium chloride (K-DUR,KLOR-CON) 10 MEQ tablet Take 10 mEq by mouth 2 (two) times daily.    [provider]  pramipexole (MIRAPEX) 0.25 MG tablet Take 0.25 mg by mouth 3 (three) times daily.    [provider]  predniSONE (DELTASONE) 10 MG tablet Take 4 tablets (40 mg total) by mouth daily with breakfast. 08/30/17 09/03/17  Rodell Perna A, PA-C  QUEtiapine (SEROQUEL) 400 MG tablet Take 400 mg by mouth at bedtime.    [provider]  senna-docusate (SENOKOT-S) 8.6-50 MG tablet Take 2 tablets by mouth 2 (two) times daily.    [provider]  spironolactone-hydrochlorothiazide (ALDACTAZIDE) 25-25 MG tablet Take 1 tablet by mouth daily.    [provider]  traMADol (ULTRAM) 50 MG tablet Take 100 mg by mouth 3 (three) times daily.    [provider]    Family History No family history on file.  Social History Social History  Substance Use Topics  . Smoking status: Former Research scientist (life sciences)  . Smokeless tobacco: Never Used  . Alcohol use No     Allergies   Aspirin; Brompheniramine; Dimetapp cold-allergy [brompheniramine-phenylephrine]; Ibuprofen; Penicillins; Tolmetin; Doxycycline; and Penicillin g   Review of Systems Review of Systems  Constitutional: Negative for chills, diaphoresis, fatigue and fever.  Musculoskeletal: Positive for arthralgias (L wrist).  Skin: Positive for color change.  Neurological: Negative for weakness and numbness.  All other systems reviewed and are negative.    Physical Exam Updated Vital Signs BP 132/78 (BP Location: Left Arm)   Pulse 90   Temp 98.3 F (36.8 C) (Oral)   Resp 16   Ht _0  (1.626 m)   Wt 72.6 kg (160 lb)   SpO2 96%   BMI 27.46 kg/m   Physical Exam  Constitutional: She  appears well-developed and well-nourished. No distress.  HENT:  Head: Normocephalic and atraumatic.  Eyes: Conjunctivae are normal. Right eye exhibits no discharge. Left eye exhibits no discharge.  Neck: No JVD present. No tracheal deviation present.  Cardiovascular: Intact distal pulses.   Tachycardic,2+ radial pulses bilaterally  Pulmonary/Chest: Effort normal.  Abdominal: She exhibits no distension.  Musculoskeletal: She exhibits edema and tenderness.       Left wrist: She exhibits decreased range of motion, tenderness and swelling. She exhibits no crepitus and no laceration.  Ulnar deviation of the fingers of bilateral hands. Left wrist with diffuse swelling extending to the dorsum of the left hand, warmth, and mild erythema. Severely reduced  range of motion secondary to pain, even with passive range of motion. 5/5 strength of the digits with flexion and extension against resistance.  Neurological: She is alert. No sensory deficit.  Fluent speech, no facial droop, sensation intact to soft touch of bilateral upper extremities.  Skin: Skin is warm and dry. Capillary refill takes less than 2 seconds. There is erythema.  Superficial skin abrasion overlying the left wrist. There is surrounding swelling. No purulent material or bleeding noted.  Psychiatric: She has a normal mood and affect. Her behavior is normal.  Nursing note and vitals reviewed.    ED Treatments / Results  Labs (all labs ordered are listed, but only abnormal results are displayed) Labs Reviewed  CBC - Abnormal; Notable for the following:       Result Value   RBC 3.80 (*)    Hemoglobin 11.3 (*)    HCT 35.4 (*)    All other components within normal limits  BASIC METABOLIC PANEL - Abnormal; Notable for the following:    Sodium 134 (*)    Chloride 100 (*)    Calcium 8.8 (*)    All other components within normal limits  SEDIMENTATION RATE - Abnormal; Notable for the following:    Sed Rate 38 (*)    All other  components within normal limits  C-REACTIVE PROTEIN - Abnormal; Notable for the following:    CRP 14.9 (*)    All other components within normal limits  SYNOVIAL CELL COUNT + DIFF, W/ CRYSTALS - Abnormal; Notable for the following:    Appearance-Synovial CLOUDY (*)    WBC, Synovial 43,500 (*)    Neutrophil, Synovial 84 (*)    Monocyte-Macrophage-Synovial Fluid 13 (*)    All other components within normal limits  BODY FLUID CULTURE  GLUCOSE, BODY FLUID OTHER    EKG  EKG Interpretation None       Radiology No results found.  Procedures .Joint Aspiration/Arthrocentesis Date/Time: 08/30/2017 12:29 PM Performed by: Rodell Perna A Authorized by: Rodell Perna A   Consent:    Consent obtained:  Verbal   Consent given by:  Patient   Risks discussed:  Bleeding, infection and pain   Alternatives discussed:  No treatment Location:    Location:  Wrist   Wrist:  L intercarpal Anesthesia (see MAR for exact dosages):    Anesthesia method:  Local infiltration   Local anesthetic:  Lidocaine 1% w/o epi Procedure details:    Preparation: Patient was prepped and draped in usual sterile fashion     Needle gauge:  18 G   Ultrasound guidance: no     Approach:  Anterior   Aspirate amount:  9m   Aspirate characteristics:  Cloudy and yellow   Steroid injected: no     Specimen collected: yes   Post-procedure details:    Dressing:  Gauze roll   Patient tolerance of procedure:  Tolerated well, no immediate complications   (including critical care time)  Medications Ordered in ED Medications  HYDROmorphone (DILAUDID) injection 1 mg (1 mg Intravenous Given 08/30/17 1051)  lidocaine (PF) (XYLOCAINE) 1 % injection 5 mL (5 mLs Intradermal Given by Other 08/30/17 1102)  fentaNYL (SUBLIMAZE) injection 50 mcg (50 mcg Intravenous Given 08/30/17 1143)  HYDROmorphone (DILAUDID) injection 1 mg (1 mg Intravenous Given 08/30/17 1308)  oxyCODONE-acetaminophen (PERCOCET/ROXICET) 5-325 MG per  tablet 2 tablet (2 tablets Oral Given 08/30/17 1549)  predniSONE (DELTASONE) tablet 60 mg (60 mg Oral Given 08/30/17 1627)     Initial Impression /  Assessment and Plan / ED Course  I have reviewed the triage vital signs and the nursing notes.  Pertinent labs & imaging results that were available during my care of the patient were reviewed by me and considered in my medical decision making (see chart for details).    Patient with acute onset of left wrist pain. Afebrile, vital signs are stable. She is neurovascularly intact. She has severely limited range of motion secondary to pain.concern for septic joint versus gout. Low suspicion of cellulitis. Arthrocentesis was performed with synovial cell count showing 43,000 WBCs as well as calcium pyrophosphate crystals. Gram stain showed no organisms seen, but did show abundant WBCs both PMNs and mononuclear. She also has an elevated ESR and CRP. Workup is consistent with pseudogout. Pain has been managed while in the ED. Wrist splint has been applied. She is unable to take NSAIDs, will discharge with prednisone and small amount of pain medication. She will follow-up with primary care physician for reevaluation. Discussed indications for return to the ED. Pt verbalized understanding of and agreement with plan and is safe for discharge home at this time.   Final Clinical Impressions(s) / ED Diagnoses   Final diagnoses:  Pseudogout of left wrist    New Prescriptions Discharge Medication List as of 08/30/2017  4:24 PM    START taking these medications   Details  oxyCODONE-acetaminophen (PERCOCET/ROXICET) 5-325 MG tablet Take 2 tablets by mouth every 8 (eight) hours as needed for severe pain., Starting Mon 08/30/2017, Print    predniSONE (DELTASONE) 10 MG tablet Take 4 tablets (40 mg total) by mouth daily with breakfast., Starting Mon 08/30/2017, Until Fri 09/03/2017, Print         Nils Flack, [place], PA-C 08/30/17 1727    Jola Schmidt,  MD 08/31/17 2132

## 2017-08-30 NOTE — ED Notes (Signed)
Pt came to nursing station asking for her pain medicine.  Sts she was told over an hour ago that it had been ordered but no one had brought it.  She said the other nurse "he" told her that it was ordered.  I looked and confirmed for her that there was no order.  I told her I would notify the provider and let her know.

## 2017-08-30 NOTE — Discharge Instructions (Signed)
Take prednisone as prescribed starting tomorrow. You received her first dose in the emergency department today. Take Percocet as needed for severe pain. Do not drive, drink alcohol, or operate heavy machinery on this medication as it may make you drowsy. Wear the wrist splint for comfort. Follow-up with primary care physician in the next 2-3 days for reevaluation.return to the ED if any concerning signs or symptoms develop such as fever or weakness.

## 2017-08-30 NOTE — ED Triage Notes (Signed)
Left hand and wrist pain and swelling since Saturday.  Pt states she had a scratch on Friday but doesn't believe its related.  No known injury.

## 2017-09-02 LAB — BODY FLUID CULTURE: Culture: NO GROWTH

## 2017-10-05 IMAGING — DX DG KNEE COMPLETE 4+V*L*
4 series · 4 of 4 positions shown · non-contrast
Comparison: None.

CLINICAL DATA: Acute left knee pain following fall last night.
Initial encounter.

EXAM:
LEFT KNEE - COMPLETE 4+ VIEW

[knee ap]
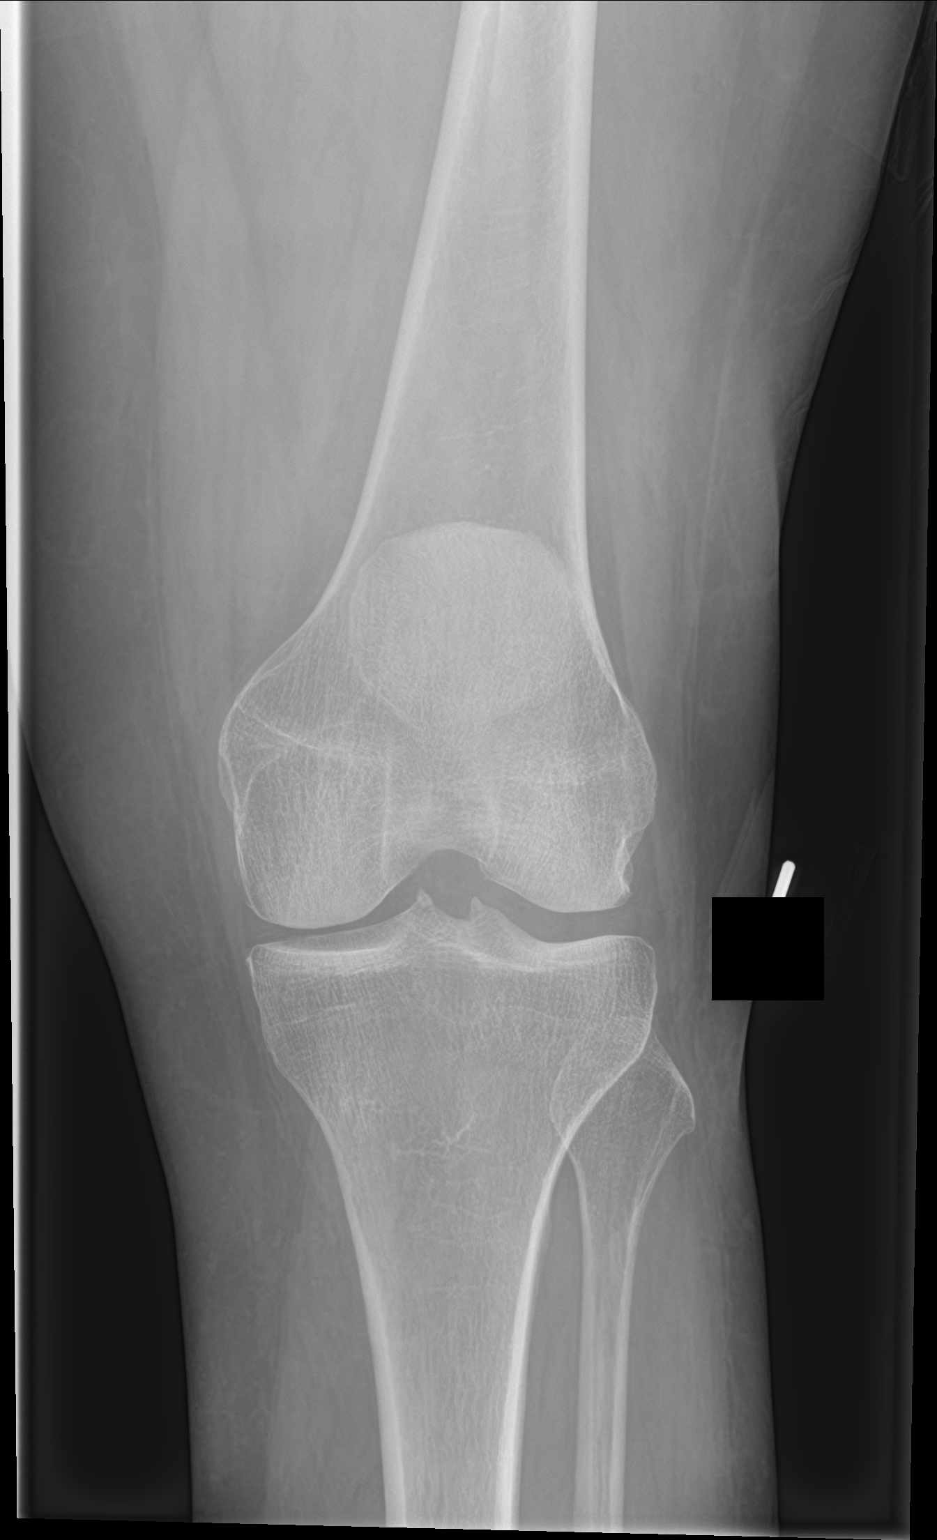

[knee lat]
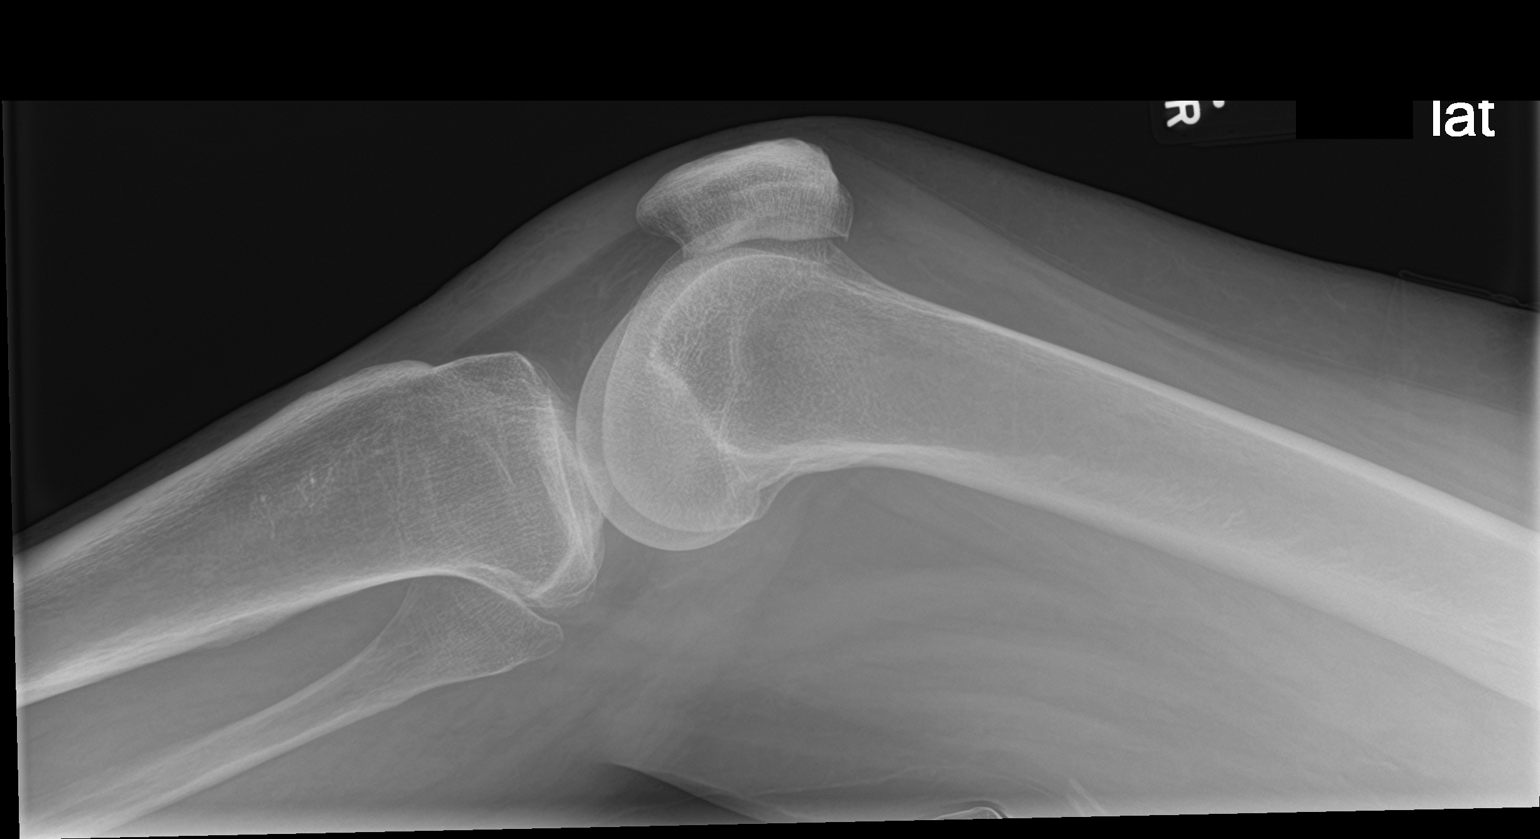

[knee obl (1 of 2)]
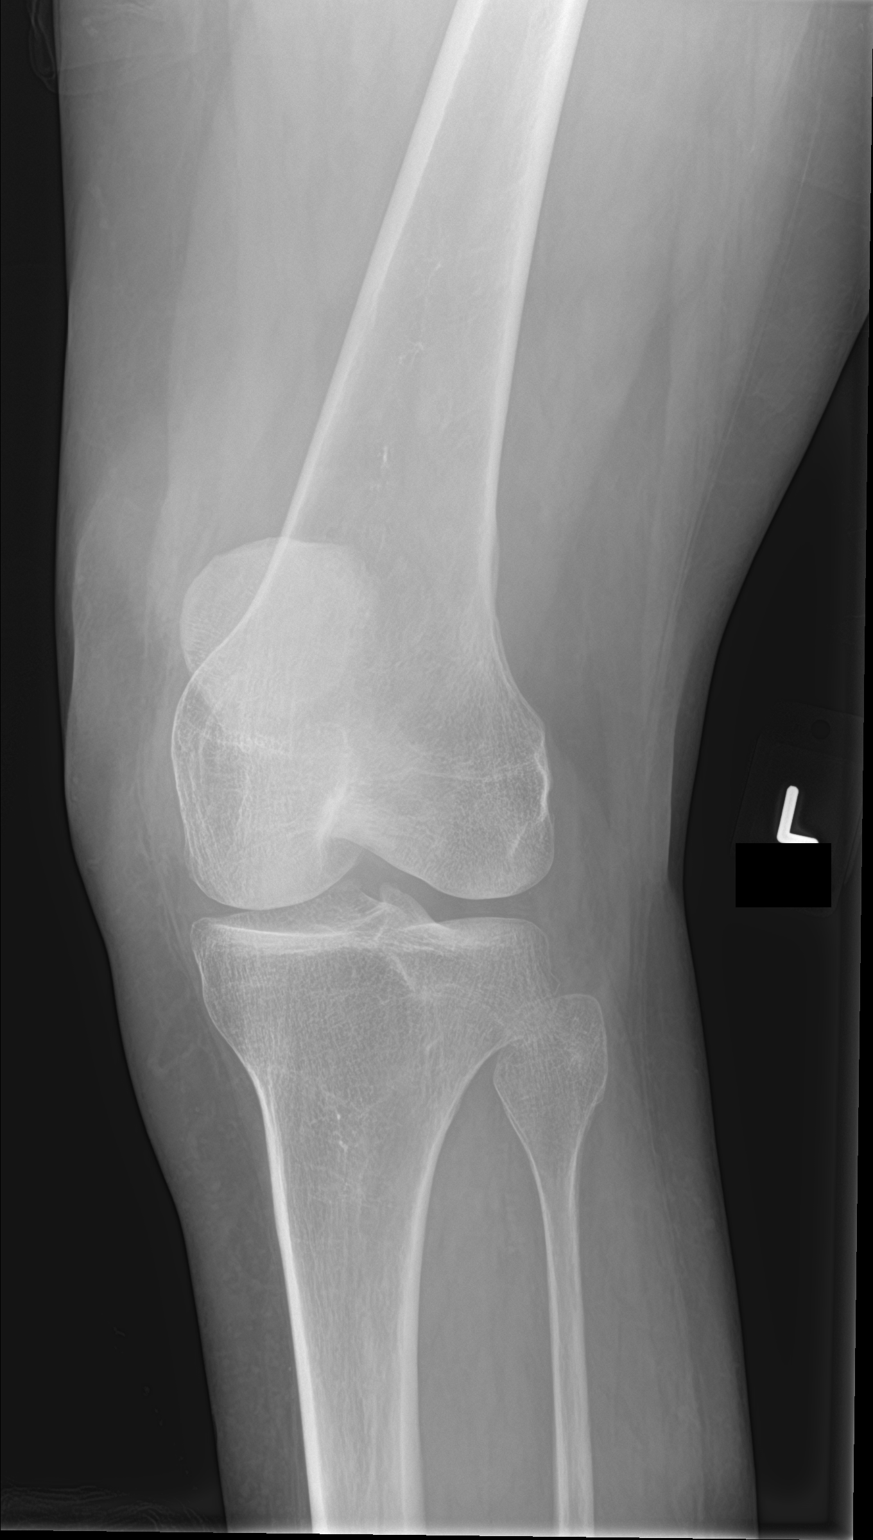

[knee obl (2 of 2)]
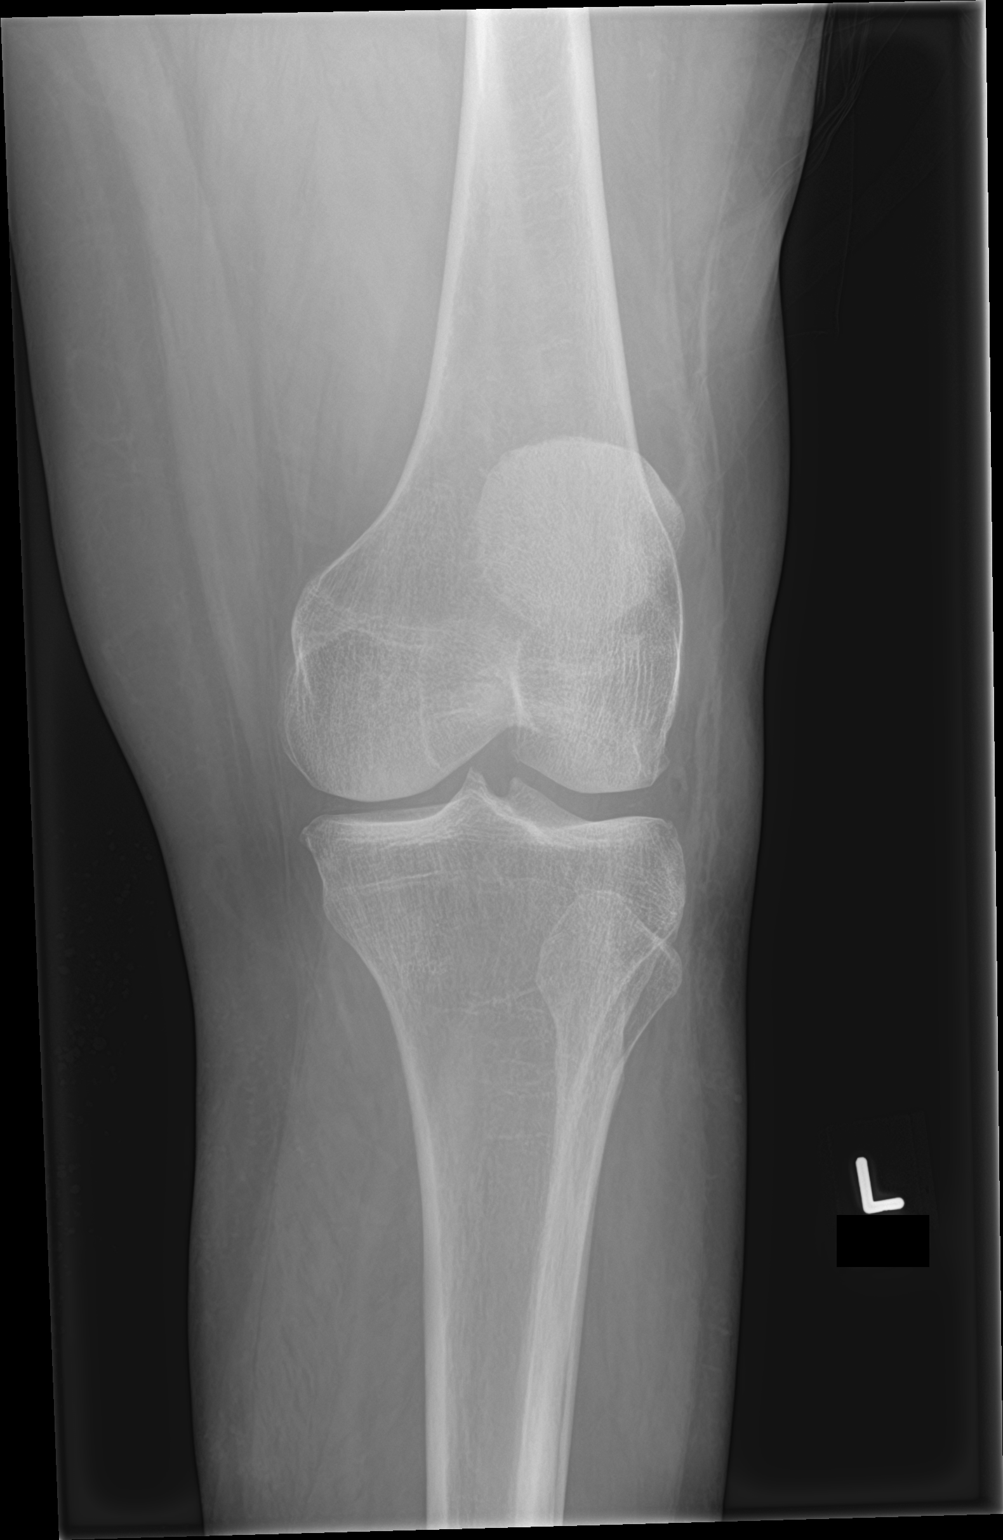

[4 of 4 positions shown; findings below may reference images not displayed]

FINDINGS: No evidence of fracture, dislocation, or joint effusion. No evidence
of arthropathy or other focal bone abnormality. Soft tissues are
unremarkable.
IMPRESSION: Negative.

## 2017-12-16 ENCOUNTER — Other Ambulatory Visit: Payer: Self-pay

## 2017-12-16 ENCOUNTER — Encounter (HOSPITAL_BASED_OUTPATIENT_CLINIC_OR_DEPARTMENT_OTHER): Payer: Self-pay | Admitting: Emergency Medicine

## 2017-12-16 ENCOUNTER — Emergency Department (HOSPITAL_BASED_OUTPATIENT_CLINIC_OR_DEPARTMENT_OTHER)
Admission: EM | Admit: 2017-12-16 | Discharge: 2017-12-16 | Disposition: A | Discharge status: Home / Self Care | Payer: Medicaid Other | Attending: Emergency Medicine | Admitting: Emergency Medicine

## 2017-12-16 DIAGNOSIS — Z79899 Other long term (current) drug therapy: Secondary | ICD-10-CM | POA: Diagnosis not present

## 2017-12-16 DIAGNOSIS — M25532 Pain in left wrist: Secondary | ICD-10-CM

## 2017-12-16 DIAGNOSIS — I10 Essential (primary) hypertension: Secondary | ICD-10-CM | POA: Insufficient documentation

## 2017-12-16 DIAGNOSIS — M069 Rheumatoid arthritis, unspecified: Secondary | ICD-10-CM

## 2017-12-16 DIAGNOSIS — M25531 Pain in right wrist: Secondary | ICD-10-CM | POA: Diagnosis present

## 2017-12-16 DIAGNOSIS — M06032 Rheumatoid arthritis without rheumatoid factor, left wrist: Secondary | ICD-10-CM | POA: Insufficient documentation

## 2017-12-16 DIAGNOSIS — Z87891 Personal history of nicotine dependence: Secondary | ICD-10-CM | POA: Insufficient documentation

## 2017-12-16 DIAGNOSIS — M06031 Rheumatoid arthritis without rheumatoid factor, right wrist: Secondary | ICD-10-CM | POA: Insufficient documentation

## 2017-12-16 MED ORDER — OXYCODONE HCL 5 MG PO TABS: 10.0000 mg | ORAL_TABLET | Freq: Once | ORAL | Status: DC

## 2017-12-16 MED ORDER — ACETAMINOPHEN 500 MG PO TABS
1000.0000 mg | ORAL_TABLET | Freq: Once | ORAL | Status: AC
  Administered 2017-12-16: 1000 mg via ORAL
  Filled 2017-12-16: qty 2

## 2017-12-16 MED ORDER — PREDNISONE 10 MG PO TABS: ORAL_TABLET | ORAL | 0 refills | Status: AC

## 2017-12-16 MED FILL — predniSONE 10 MG TABS: 10 | 11 days supply | Qty: 31 | Fill #0

## 2017-12-16 NOTE — ED Triage Notes (Signed)
Pt woke up this am with bilateral wrist pain.  Pt has RA.  Pt states this has happened before in the past.  Pt has received steroid injections in the past for same.  Both wrists appear swollen and slightly deformed.

## 2017-12-16 NOTE — ED Provider Notes (Signed)
[facility] EMERGENCY DEPARTMENT Provider Note   CSN: [id number] Arrival date & time: 12/16/17  0741     History   Chief Complaint Chief Complaint  Patient presents with  . Wrist Pain    HPI Jennifer Hernandez is a 62 y.o. female.  HPI   62 year old female with a history of hypertension, remote leukemia in remission, rheumatoid arthritis, admission at the beginning of January to [facility] for altered mental likely secondary to polypharmacy also with rheumatoid arthritis flare, who presents with concern for wrist pain.  Reports she woke up this morning with bilateral wrist pain.  She has not been taking anything yet for her pain.  Denies fevers, no new injury.  She had been on steroids in the hospital recently, however reports she was not discharged with steroid rx.  She reports she was in hospital for blood pressures, however notes report ams secondary to polyarthritis.   Past Medical History:  Diagnosis Date  . Arthritis   . Hypertension   . Leukemia ([place])   . Osteoporosis   . RA (rheumatoid arthritis) ([place])   . Seizures ([place])    last seizure in 2012    Patient Active Problem List   Diagnosis Date Noted  . Acute bronchitis 12/17/2016  . Rheumatoid arthritis ([place]) 12/17/2016  . History of leukemia 12/17/2016  . Essential hypertension, benign 12/17/2016  . Hypokalemia 12/17/2016    Past Surgical History:  Procedure Laterality Date  . ABDOMINAL HYSTERECTOMY    . APPENDECTOMY    . arm surgery    . BACK SURGERY    . CESAREAN SECTION    . NECK SURGERY    . SHOULDER SURGERY      OB History    No data available       Home Medications    Prior to Admission medications   Medication Sig Start Date End Date Taking? Authorizing Provider  albuterol (PROVENTIL HFA;VENTOLIN HFA) 108 (90 Base) MCG/ACT inhaler Inhale 2 puffs into the lungs every 4 (four) hours as needed for wheezing or shortness of breath.    [provider]    amLODipine (NORVASC) 5 MG tablet Take 5 mg by mouth daily.    [provider]  busPIRone (BUSPAR) 10 MG tablet Take 40 mg by mouth 2 (two) times daily.     [provider]  Calcium Carb-Cholecalciferol (CALCIUM 1000 + D) 1000-800 MG-UNIT TABS Take 1 tablet by mouth daily.    [provider]  citalopram (CELEXA) 20 MG tablet Take 20 mg by mouth daily.    [provider]  diazepam (VALIUM) 10 MG tablet Take 10 mg by mouth every 6 (six) hours as needed for anxiety or sleep.    [provider]  esomeprazole (NEXIUM) 20 MG capsule Take 40 mg by mouth 2 (two) times daily before a meal.     [provider]  estrogens, conjugated, (PREMARIN) 1.25 MG tablet Take 1.25 mg by mouth daily.    [provider]  fexofenadine (ALLEGRA) 60 MG tablet Take 60 mg by mouth 2 (two) times daily.    [provider]  fluticasone (FLONASE) 50 MCG/ACT nasal spray Place 1 spray into both nostrils daily.    [provider]  Fluticasone-Salmeterol (ADVAIR) 500-50 MCG/DOSE AEPB Inhale 1 puff into the lungs 2 (two) times daily.    [provider]  folic acid (FOLVITE) 1 MG tablet Take 1 mg by mouth daily.    [provider]  gabapentin (  NEURONTIN) 300 MG capsule Take 600 mg by mouth at bedtime.    [provider]  leflunomide (ARAVA) 10 MG tablet Take 10 mg by mouth daily.    [provider]  methocarbamol (ROBAXIN) 750 MG tablet Take 750 mg by mouth 4 (four) times daily.    [provider]  methotrexate 2.5 MG tablet Take 15 mg by mouth once a week.    [provider]  mirabegron ER (MYRBETRIQ) 25 MG TB24 tablet Take 25 mg by mouth 2 (two) times daily.    [provider]  oxyCODONE-acetaminophen (PERCOCET/ROXICET) 5-325 MG tablet Take 2 tablets by mouth every 8 (eight) hours as needed for severe pain. 08/30/17   Fawze, Mina A, PA-C  potassium chloride (K-DUR,KLOR-CON) 10 MEQ tablet Take  10 mEq by mouth 2 (two) times daily.    [provider]  pramipexole (MIRAPEX) 0.25 MG tablet Take 0.25 mg by mouth 3 (three) times daily.    [provider]  predniSONE (DELTASONE) 10 MG tablet Take 40mg  (4 tablets) daily for 4 days, 30mg  (3 tablets) daily for 3 days, 20mg  (2 tablets daily for 2 days) and 10mg  daily for 2 days. 12/16/17   Gareth Morgan, MD  QUEtiapine (SEROQUEL) 400 MG tablet Take 400 mg by mouth at bedtime.    [provider]  senna-docusate (SENOKOT-S) 8.6-50 MG tablet Take 2 tablets by mouth 2 (two) times daily.    [provider]  spironolactone-hydrochlorothiazide (ALDACTAZIDE) 25-25 MG tablet Take 1 tablet by mouth daily.    [provider]  traMADol (ULTRAM) 50 MG tablet Take 100 mg by mouth 3 (three) times daily.    [provider]    Family History No family history on file.  Social History Social History   Tobacco Use  . Smoking status: Former Research scientist (life sciences)  . Smokeless tobacco: Never Used  Substance Use Topics  . Alcohol use: No  . Drug use: No     Allergies   Aspirin; Brompheniramine; Dimetapp cold-allergy [brompheniramine-phenylephrine]; Ibuprofen; Penicillins; Tolmetin; Doxycycline; and Penicillin g   Review of Systems Review of Systems  Constitutional: Negative for fever.  HENT: Negative for sore throat.   Eyes: Negative for visual disturbance.  Respiratory: Negative for cough and shortness of breath.   Cardiovascular: Negative for chest pain.  Gastrointestinal: Negative for abdominal pain.  Genitourinary: Negative for difficulty urinating.  Musculoskeletal: Positive for arthralgias.  Skin: Negative for rash.  Neurological: Negative for syncope.     Physical Exam Updated Vital Signs BP (!) 145/86 (BP Location: Left Arm)   Pulse (!) 109   Temp 97.9 F (36.6 C) (Oral)   Resp 16   Ht 5\' 4"  (1.626 m)   Wt 72.6 kg (160 lb)   SpO2 99%   BMI 27.46 kg/m   Physical Exam  Constitutional:  She is oriented to person, place, and time. She appears well-developed and well-nourished. No distress.  HENT:  Head: Normocephalic and atraumatic.  Eyes: Conjunctivae and EOM are normal.  Neck: Normal range of motion.  Cardiovascular: Normal rate, regular rhythm and intact distal pulses.  Pulmonary/Chest: Effort normal and breath sounds normal. No respiratory distress.  Abdominal: There is no guarding.  Musculoskeletal: She exhibits no edema.       Right wrist: She exhibits tenderness, bony tenderness and deformity (chronic).       Left wrist: She exhibits decreased range of motion, tenderness, bony tenderness and deformity (chronic).  Neurological: She is alert and oriented to person, place, and time.  Skin: Skin is warm and dry. No rash noted. She is not diaphoretic. No erythema.  Nursing note and vitals reviewed.    ED Treatments / Results  Labs (all labs ordered are listed, but only abnormal results are displayed) Labs Reviewed - No data to display  EKG  EKG Interpretation None       Radiology No results found.  Procedures Procedures (including critical care time)  Medications Ordered in ED Medications  acetaminophen (TYLENOL) tablet 1,000 mg (1,000 mg Oral Given 12/16/17 0845)     Initial Impression / Assessment and Plan / ED Course  I have reviewed the triage vital signs and the nursing notes.  Pertinent labs & imaging results that were available during my care of the patient were reviewed by me and considered in my medical decision making (see chart for details).     61 year old female with a history of hypertension, remote leukemia in remission, rheumatoid arthritis, admission at the beginning of January to [facility] hospital for altered mental likely secondary to polypharmacy also with rheumatoid arthritis flare, who presents with concern for wrist pain.  No fevers, bilateral symptoms, and doubt septic arthritis. No hx of new injury. Suspected flare  of RA.  Will give steroid taper. Given dose of oxycodone and tylenol in ED, recommend pt take her prescribed medications as outpatient and follow up with her rheumatologist. Patient discharged in stable condition with understanding of reasons to return.    Final Clinical Impressions(s) / ED Diagnoses   Final diagnoses:  Rheumatoid arthritis involving both wrists, unspecified rheumatoid factor presence ([place])  Bilateral wrist pain    ED Discharge Orders        Ordered    predniSONE (DELTASONE) 10 MG tablet     12/16/17 7824       Gareth Morgan, MD 12/16/17 2353

## 2018-02-20 IMAGING — CR DG CHEST 2V
2 series · 2 of 2 positions shown · non-contrast
Comparison: None.

CLINICAL DATA: Cough, fever, shortness of breath.

EXAM:
CHEST  2 VIEW

[w chest pa]
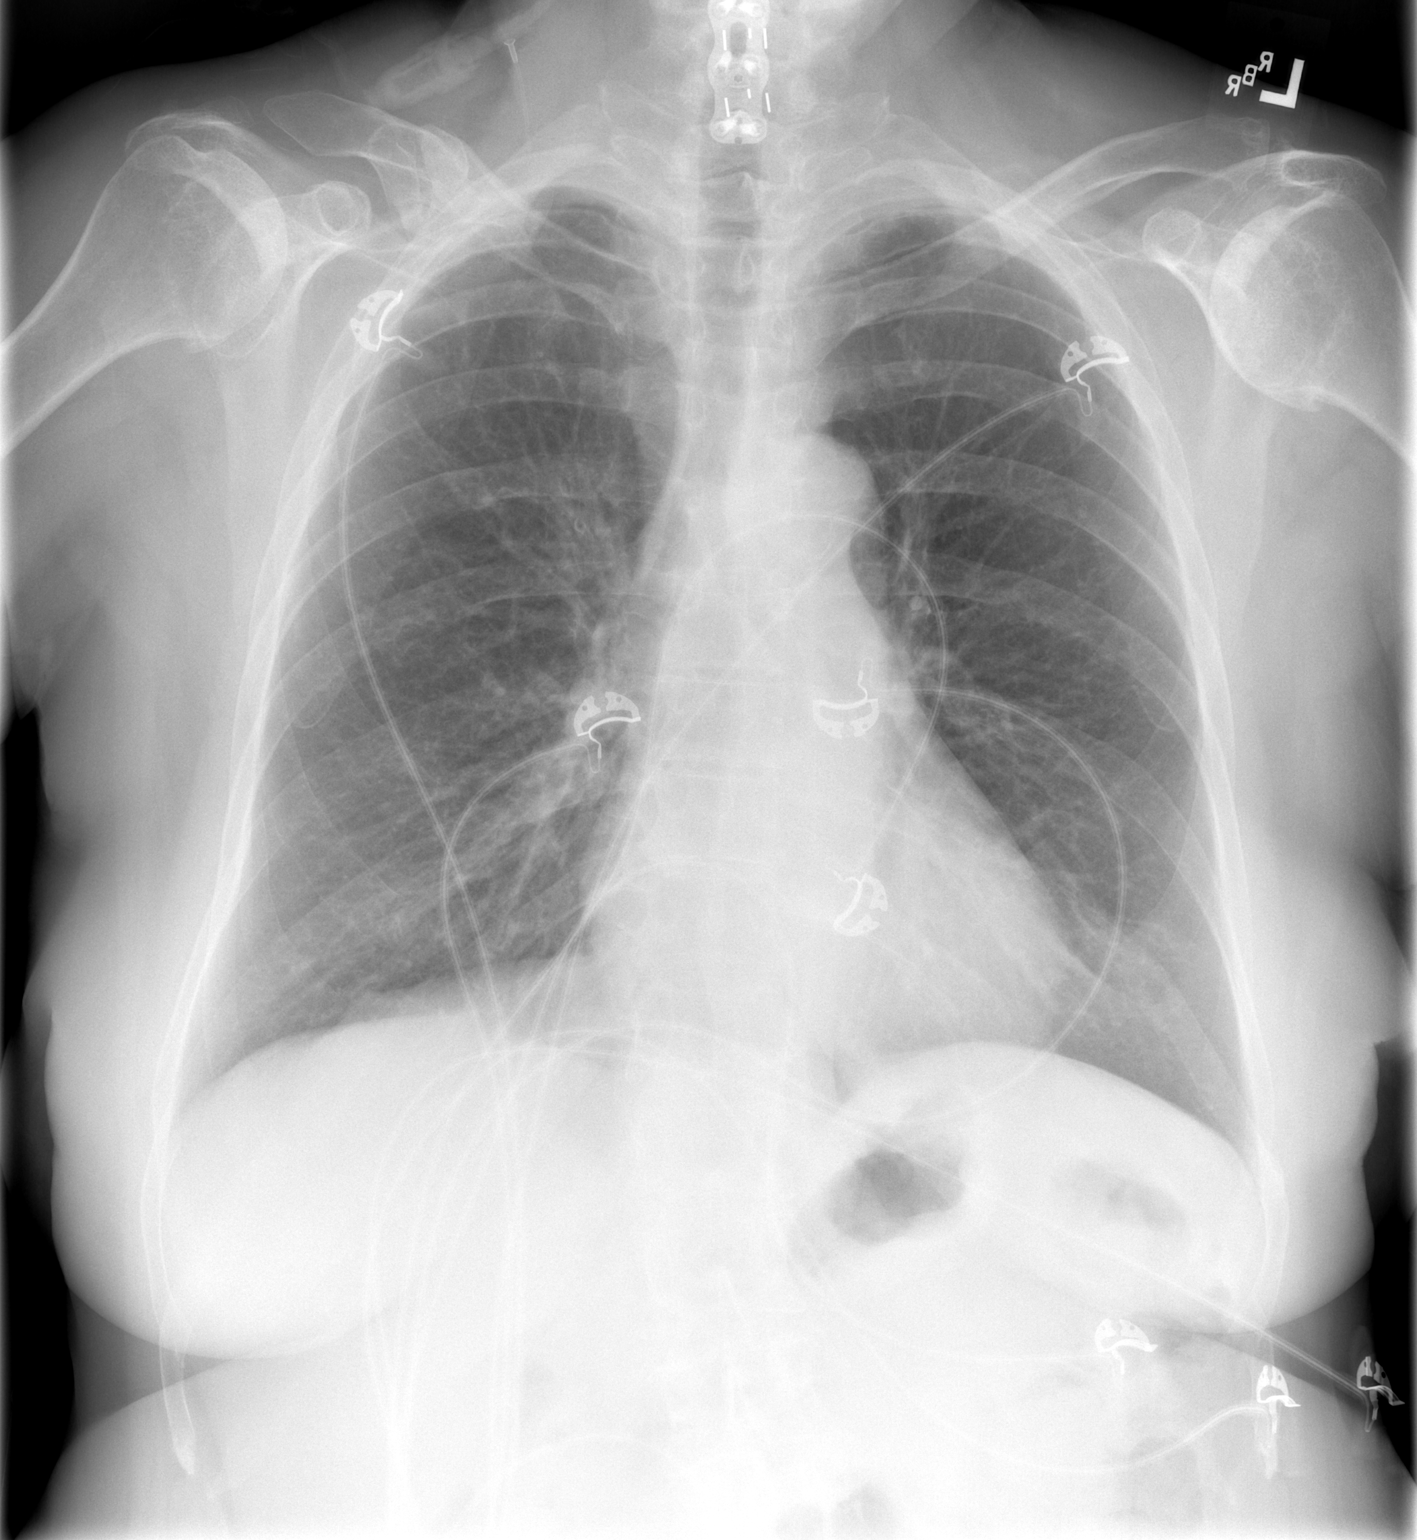

[w chest lat]
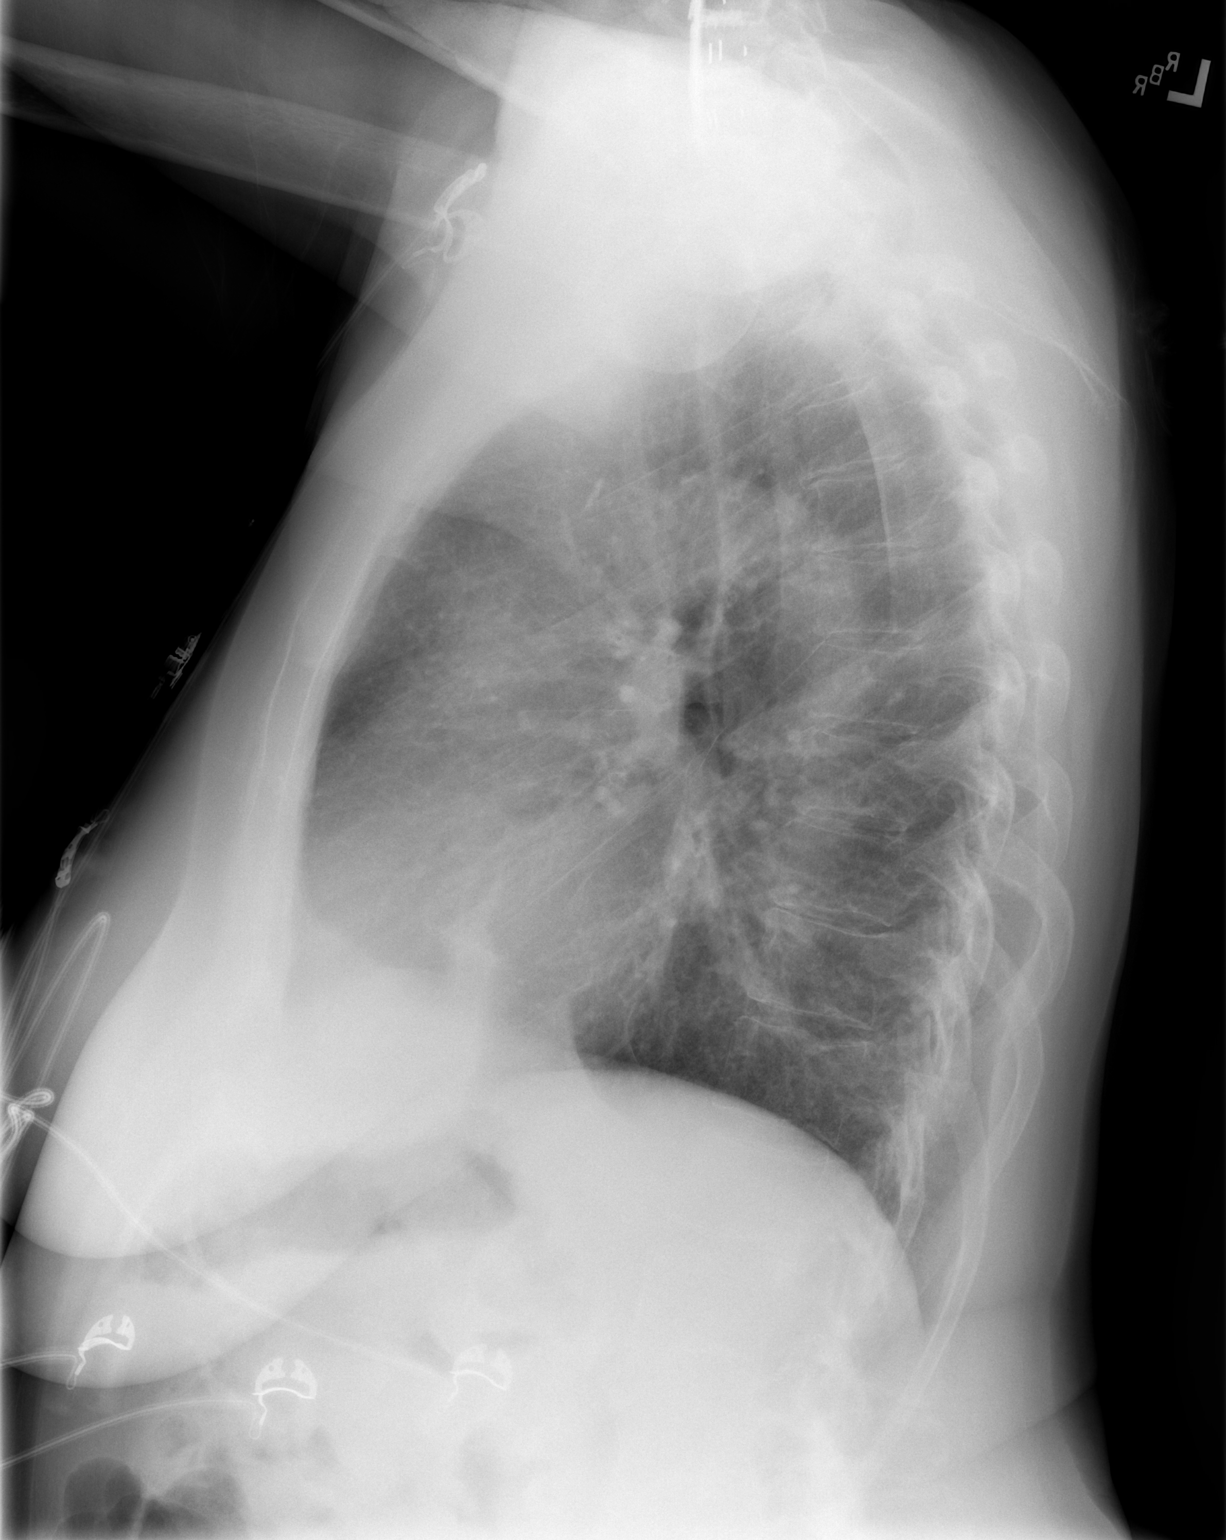

[2 of 2 positions shown; findings below may reference images not displayed]

FINDINGS: The heart size and mediastinal contours are within normal limits.
Both lungs are clear. No pneumothorax or pleural effusion is noted.
The visualized skeletal structures are unremarkable.
IMPRESSION: No active cardiopulmonary disease.

## 2018-12-24 ENCOUNTER — Other Ambulatory Visit: Payer: Self-pay

## 2018-12-24 ENCOUNTER — Encounter (HOSPITAL_BASED_OUTPATIENT_CLINIC_OR_DEPARTMENT_OTHER): Payer: Self-pay | Admitting: Emergency Medicine

## 2018-12-24 ENCOUNTER — Emergency Department (HOSPITAL_BASED_OUTPATIENT_CLINIC_OR_DEPARTMENT_OTHER): Payer: Medicaid Other

## 2018-12-24 ENCOUNTER — Emergency Department (HOSPITAL_BASED_OUTPATIENT_CLINIC_OR_DEPARTMENT_OTHER)
Admission: EM | Admit: 2018-12-24 | Discharge: 2018-12-24 | Disposition: A | Discharge status: Home / Self Care | Payer: Medicaid Other | Attending: Emergency Medicine | Admitting: Emergency Medicine

## 2018-12-24 DIAGNOSIS — Y9389 Activity, other specified: Secondary | ICD-10-CM | POA: Diagnosis not present

## 2018-12-24 DIAGNOSIS — I10 Essential (primary) hypertension: Secondary | ICD-10-CM | POA: Diagnosis not present

## 2018-12-24 DIAGNOSIS — Z79899 Other long term (current) drug therapy: Secondary | ICD-10-CM | POA: Insufficient documentation

## 2018-12-24 DIAGNOSIS — Y929 Unspecified place or not applicable: Secondary | ICD-10-CM | POA: Diagnosis not present

## 2018-12-24 DIAGNOSIS — S92325A Nondisplaced fracture of second metatarsal bone, left foot, initial encounter for closed fracture: Secondary | ICD-10-CM | POA: Diagnosis not present

## 2018-12-24 DIAGNOSIS — S99912A Unspecified injury of left ankle, initial encounter: Secondary | ICD-10-CM | POA: Diagnosis present

## 2018-12-24 DIAGNOSIS — Y998 Other external cause status: Secondary | ICD-10-CM | POA: Diagnosis not present

## 2018-12-24 DIAGNOSIS — W010XXA Fall on same level from slipping, tripping and stumbling without subsequent striking against object, initial encounter: Secondary | ICD-10-CM | POA: Diagnosis not present

## 2018-12-24 DIAGNOSIS — Z856 Personal history of leukemia: Secondary | ICD-10-CM | POA: Diagnosis not present

## 2018-12-24 DIAGNOSIS — Z87891 Personal history of nicotine dependence: Secondary | ICD-10-CM | POA: Diagnosis not present

## 2018-12-24 MED ORDER — HYDROCODONE-ACETAMINOPHEN 5-325 MG PO TABS
1.0000 | ORAL_TABLET | Freq: Once | ORAL | Status: AC
  Administered 2018-12-24: 1 via ORAL
  Filled 2018-12-24: qty 1

## 2018-12-24 MED ORDER — ACETAMINOPHEN 325 MG PO TABS
650.0000 mg | ORAL_TABLET | Freq: Once | ORAL | Status: AC
  Administered 2018-12-24: 650 mg via ORAL
  Filled 2018-12-24: qty 2

## 2018-12-24 NOTE — ED Triage Notes (Signed)
Pt tripped over her dog yesterday and fell. C/o L ankle pain.

## 2018-12-24 NOTE — ED Provider Notes (Signed)
[facility] Provider Note   CSN: [id number] Arrival date & time: 12/24/18  1147     History   Chief Complaint Chief Complaint  Patient presents with  . Fall    HPI Almas Rake is a 63 y.o. female.  HPI  63 year old female presents with left ankle pain.  2 days ago she tripped over her dog and thinks she twisted her leg.  She is having pain mostly in the ankle but also in the foot and in her left lower leg.  There is swelling to her ankle.  No weakness.  She has not take anything for the pain.  She is able to ambulate but it is painful. Some tingling.  Past Medical History:  Diagnosis Date  . Arthritis   . Hypertension   . Leukemia ([place])   . Osteoporosis   . RA (rheumatoid arthritis) ([place])   . Seizures ([place])    last seizure in 2012    Patient Active Problem List   Diagnosis Date Noted  . Acute bronchitis 12/17/2016  . Rheumatoid arthritis ([place]) 12/17/2016  . History of leukemia 12/17/2016  . Essential hypertension, benign 12/17/2016  . Hypokalemia 12/17/2016    Past Surgical History:  Procedure Laterality Date  . ABDOMINAL HYSTERECTOMY    . APPENDECTOMY    . arm surgery    . BACK SURGERY    . CESAREAN SECTION    . NECK SURGERY    . SHOULDER SURGERY       OB History   No obstetric history on file.      Home Medications    Prior to Admission medications   Medication Sig Start Date End Date Taking? Authorizing Provider  albuterol (PROVENTIL HFA;VENTOLIN HFA) 108 (90 Base) MCG/ACT inhaler Inhale 2 puffs into the lungs every 4 (four) hours as needed for wheezing or shortness of breath.    [provider]  amLODipine (NORVASC) 5 MG tablet Take 5 mg by mouth daily.    [provider]  busPIRone (BUSPAR) 10 MG tablet Take 40 mg by mouth 2 (two) times daily.     [provider]  Calcium Carb-Cholecalciferol (CALCIUM 1000 + D) 1000-800 MG-UNIT TABS Take 1 tablet by mouth daily.    [provider]  citalopram (CELEXA) 20 MG tablet Take 20 mg by mouth daily.    [provider]  diazepam (VALIUM) 10 MG tablet Take 10 mg by mouth every 6 (six) hours as needed for anxiety or sleep.    [provider]  esomeprazole (NEXIUM) 20 MG capsule Take 40 mg by mouth 2 (two) times daily before a meal.     [provider]  estrogens, conjugated, (PREMARIN) 1.25 MG tablet Take 1.25 mg by mouth daily.    [provider]  fexofenadine (ALLEGRA) 60 MG tablet Take 60 mg by mouth 2 (two) times daily.    [provider]  fluticasone (FLONASE) 50 MCG/ACT nasal spray Place 1 spray into both nostrils daily.    [provider]  Fluticasone-Salmeterol (ADVAIR) 500-50 MCG/DOSE AEPB Inhale 1 puff into the lungs 2 (two) times daily.    [provider]  folic acid (FOLVITE) 1 MG tablet Take 1 mg by mouth daily.    [provider]  gabapentin (NEURONTIN) 300 MG capsule Take 600 mg by mouth at bedtime.    [provider]  leflunomide (ARAVA) 10 MG tablet Take 10 mg by mouth daily.    [provider]  methocarbamol (  ROBAXIN) 750 MG tablet Take 750 mg by mouth 4 (four) times daily.    [provider]  methotrexate 2.5 MG tablet Take 15 mg by mouth once a week.    [provider]  mirabegron ER (MYRBETRIQ) 25 MG TB24 tablet Take 25 mg by mouth 2 (two) times daily.    [provider]  oxyCODONE-acetaminophen (PERCOCET/ROXICET) 5-325 MG tablet Take 2 tablets by mouth every 8 (eight) hours as needed for severe pain. 08/30/17   Fawze, Mina A, PA-C  potassium chloride (K-DUR,KLOR-CON) 10 MEQ tablet Take 10 mEq by mouth 2 (two) times daily.    [provider]  pramipexole (MIRAPEX) 0.25 MG tablet Take 0.25 mg by mouth 3 (three) times daily.    [provider]  predniSONE (DELTASONE) 10 MG tablet Take 40mg  (4 tablets) daily for 4 days, 30mg  (3 tablets) daily for 3 days, 20mg  (2 tablets daily for 2  days) and 10mg  daily for 2 days. 12/16/17   Gareth Morgan, MD  QUEtiapine (SEROQUEL) 400 MG tablet Take 400 mg by mouth at bedtime.    [provider]  senna-docusate (SENOKOT-S) 8.6-50 MG tablet Take 2 tablets by mouth 2 (two) times daily.    [provider]  spironolactone-hydrochlorothiazide (ALDACTAZIDE) 25-25 MG tablet Take 1 tablet by mouth daily.    [provider]  traMADol (ULTRAM) 50 MG tablet Take 100 mg by mouth 3 (three) times daily.    [provider]    Family History No family history on file.  Social History Social History   Tobacco Use  . Smoking status: Former Research scientist (life sciences)  . Smokeless tobacco: Never Used  Substance Use Topics  . Alcohol use: No  . Drug use: No     Allergies   Aspirin; Brompheniramine; Dimetapp cold-allergy [brompheniramine-phenylephrine]; Ibuprofen; Penicillins; Tolmetin; Doxycycline; and Penicillin g   Review of Systems Review of Systems  Musculoskeletal: Positive for arthralgias and joint swelling.  Neurological: Negative for weakness.     Physical Exam Updated Vital Signs BP 137/76   Pulse 71   Temp 97.8 F (36.6 C) (Oral)   Resp 18   Ht 5\' 4"  (1.626 m)   Wt 72 kg   SpO2 99%   BMI 27.25 kg/m   Physical Exam Vitals signs and nursing note reviewed.  Constitutional:      General: She is not in acute distress.    Appearance: She is well-developed. She is not ill-appearing or diaphoretic.  HENT:     Head: Normocephalic and atraumatic.     Right Ear: External ear normal.     Left Ear: External ear normal.     Nose: Nose normal.  Eyes:     General:        Right eye: No discharge.        Left eye: No discharge.  Cardiovascular:     Rate and Rhythm: Normal rate and regular rhythm.     Pulses:          Dorsalis pedis pulses are 2+ on the left side.  Pulmonary:     Effort: Pulmonary effort is normal.  Abdominal:     General: There is no distension.  Musculoskeletal:     Left knee: No  tenderness found.     Left ankle: She exhibits swelling (mild). She exhibits normal range of motion. Tenderness. Achilles tendon exhibits no pain and no defect.     Left lower leg: She exhibits tenderness (diffuse).     Left foot: Tenderness and  swelling (proximal) present.     Comments: Normal strength/sensation in left foot  Skin:    General: Skin is warm and dry.  Neurological:     Mental Status: She is alert.  Psychiatric:        Mood and Affect: Mood is not anxious.      ED Treatments / Results  Labs (all labs ordered are listed, but only abnormal results are displayed) Labs Reviewed - No data to display  EKG None  Radiology Dg Tibia/fibula Left  Result Date: 12/24/2018 CLINICAL DATA:  Left leg pain after tripping over her dog 2 days previously EXAM: LEFT TIBIA AND FIBULA - 2 VIEW COMPARISON:  Prior radiographs of the knees 03/02/2017 FINDINGS: No evidence of acute fracture, malalignment or knee joint effusion. Mild degenerative change with narrowing of the medial and patellofemoral joint spaces. Small osteophyte formation is present. Peaking of the tibial spines also noted. Normal bony mineralization. No lytic or blastic osseous lesion. No focal soft tissue abnormality. IMPRESSION: 1. No acute abnormality. 2. 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 Electronically Signed   By: Jacqulynn Cadet M.D.   On: 12/24/2018 13:00   Dg Ankle Complete Left  Result Date: 12/24/2018 CLINICAL DATA:  63 year old female with history of trauma from a fall 2 days ago complaining of left ankle pain. EXAM: LEFT ANKLE COMPLETE - 3+ VIEW COMPARISON:  None. FINDINGS: There is no evidence of fracture, dislocation, or joint effusion. There is no evidence of arthropathy or other focal bone abnormality. Plantar calcaneal spur period soft tissues are unremarkable. IMPRESSION: No acute abnormality of the left ankle. Electronically Signed   By: Vinnie Langton M.D.   On:  12/24/2018 12:54   Dg Foot Complete Left  Result Date: 12/24/2018 CLINICAL DATA:  63 year old female with persistent left distal tib-fib ankle and dorsal foot pain after tripping and falling on her dog 2 days previously EXAM: LEFT FOOT - COMPLETE 3+ VIEW COMPARISON:  Remote prior radiographs of the left foot 01/30/2016 FINDINGS: Healing/healed fracture of the third metatarsal. Additionally, there is a persistent lucency through the proximal aspect of the second metatarsal with a sclerotic margin and evidence of osteophyte formation. This appears to represent nonunion of a prior fracture. There is some overlying soft tissue edema. No definite acute fracture identified. IMPRESSION: 1. 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 2. Healed/healing fracture through the neck of the third metatarsal. Electronically Signed   By: Jacqulynn Cadet M.D.   On: 12/24/2018 12:59    Procedures Procedures (including critical care time)  Medications Ordered in ED Medications  acetaminophen (TYLENOL) tablet 650 mg (650 mg Oral Given 12/24/18 1220)  HYDROcodone-acetaminophen (NORCO/VICODIN) 5-325 MG per tablet 1 tablet (1 tablet Oral Given 12/24/18 1356)     Initial Impression / Assessment and Plan / ED Course  I have reviewed the triage vital signs and the nursing notes.  Pertinent labs & imaging results that were available during my care of the patient were reviewed by me and considered in my medical decision making (see chart for details).     No fractures of the ankle or tib-fib.  Possible fracture of the second metatarsal though it is in the same area of a previous fracture.  She is neurovascular intact.  Will place in cam walker and have her follow-up with her orthopedist, Dr. Laurance Flatten.  Given dose of hydrocodone here.  Final Clinical Impressions(s) / ED  Diagnoses   Final diagnoses:  Closed nondisplaced fracture of second  metatarsal bone of left foot, initial encounter    ED Discharge Orders    None       Sherwood Gambler, MD 12/24/18 1435

## 2019-02-10 ENCOUNTER — Emergency Department (HOSPITAL_BASED_OUTPATIENT_CLINIC_OR_DEPARTMENT_OTHER)
Admission: EM | Admit: 2019-02-10 | Discharge: 2019-02-10 | Disposition: A | Discharge status: Home / Self Care | Payer: Medicaid Other | Attending: Emergency Medicine | Admitting: Emergency Medicine

## 2019-02-10 ENCOUNTER — Other Ambulatory Visit: Payer: Self-pay

## 2019-02-10 ENCOUNTER — Encounter (HOSPITAL_BASED_OUTPATIENT_CLINIC_OR_DEPARTMENT_OTHER): Payer: Self-pay

## 2019-02-10 ENCOUNTER — Emergency Department (HOSPITAL_BASED_OUTPATIENT_CLINIC_OR_DEPARTMENT_OTHER): Payer: Medicaid Other

## 2019-02-10 DIAGNOSIS — Z856 Personal history of leukemia: Secondary | ICD-10-CM | POA: Diagnosis not present

## 2019-02-10 DIAGNOSIS — I10 Essential (primary) hypertension: Secondary | ICD-10-CM | POA: Diagnosis not present

## 2019-02-10 DIAGNOSIS — Z87891 Personal history of nicotine dependence: Secondary | ICD-10-CM | POA: Insufficient documentation

## 2019-02-10 DIAGNOSIS — M25552 Pain in left hip: Secondary | ICD-10-CM | POA: Insufficient documentation

## 2019-02-10 DIAGNOSIS — Z79899 Other long term (current) drug therapy: Secondary | ICD-10-CM | POA: Insufficient documentation

## 2019-02-10 MED ORDER — METHOCARBAMOL 500 MG PO TABS: 500.0000 mg | ORAL_TABLET | Freq: Two times a day (BID) | ORAL | 0 refills | Status: AC

## 2019-02-10 NOTE — ED Notes (Signed)
Pt amb to BR w/o difficulty 

## 2019-02-10 NOTE — Discharge Instructions (Signed)
You can take Tylenol or Ibuprofen as directed for pain. You can alternate Tylenol and Ibuprofen every 4 hours. If you take Tylenol at 1pm, then you can take Ibuprofen at 5pm. Then you can take Tylenol again at 9pm.   Take Robaxin as prescribed. This medication will make you drowsy so do not drive or drink alcohol when taking it.  Follow the RICE (Rest, Ice, Compression, Elevation) protocol as directed.   With your primary care doctor.  Return emergency department for any worsening pain, numbness/weakness of the leg, difficulty [place] worsening or concerning symptoms.

## 2019-02-10 NOTE — ED Triage Notes (Signed)
Pt tripped over her dog this morning and fell onto the floor, landed on left lower back/hip area. She is ambulatory with mild discomfort, no bruising or trauma present on back where the impact was

## 2019-02-10 NOTE — ED Notes (Signed)
Pt verbalizes understanding of d/c instructions and denies any further need at this time. 

## 2019-02-10 NOTE — ED Provider Notes (Signed)
[facility] Provider Note   CSN: [id number] Arrival date & time: 02/10/19  1708    History   Chief Complaint Chief Complaint  Patient presents with   Fall    HPI Jennifer Hernandez is a 63 y.o. female history of leukemia, hypertension, RA who presents for evaluation of left hip and lower back pain after mechanical fall that occurred about 3 hours prior to ED arrival.  Patient reports that she was walking her house and states that she tripped over her dog who she did not see.  This caused her to fall and land on her left hip.  She denies any head injury or LOC.  Patient states that she was able to get up and walk a few steps.  She states that she had worsening pain with attempting to bear weight and ambulation.  Patient reports she did not take any medication for pain.  Patient reports pain is worse with movement of the hip or attempts to ambulate or bear weight.  She denies any numbness/weakness.  Patient denies any preceding chest pain or dizziness.  Patient denies any saddle anesthesia, urinary or bowel incontinence.     The history is provided by the patient.    Past Medical History:  Diagnosis Date   Arthritis    Hypertension    Leukemia ([place])    Osteoporosis    RA (rheumatoid arthritis) ([place])    Seizures ([place])    last seizure in 2012    Patient Active Problem List   Diagnosis Date Noted   Acute bronchitis 12/17/2016   Rheumatoid arthritis ([place]) 12/17/2016   History of leukemia 12/17/2016   Essential hypertension, benign 12/17/2016   Hypokalemia 12/17/2016    Past Surgical History:  Procedure Laterality Date   ABDOMINAL HYSTERECTOMY     APPENDECTOMY     arm surgery     [place]       OB History   No obstetric history on file.      Home Medications    Prior to Admission medications   Medication Sig Start Date End Date Taking? Authorizing  Provider  albuterol (PROVENTIL HFA;VENTOLIN HFA) 108 (90 Base) MCG/ACT inhaler Inhale 2 puffs into the lungs every 4 (four) hours as needed for wheezing or shortness of breath.    [provider]  amLODipine (NORVASC) 5 MG tablet Take 5 mg by mouth daily.    [provider]  busPIRone (BUSPAR) 10 MG tablet Take 40 mg by mouth 2 (two) times daily.     [provider]  Calcium Carb-Cholecalciferol (CALCIUM 1000 + D) 1000-800 MG-UNIT TABS Take 1 tablet by mouth daily.    [provider]  citalopram (CELEXA) 20 MG tablet Take 20 mg by mouth daily.    [provider]  diazepam (VALIUM) 10 MG tablet Take 10 mg by mouth every 6 (six) hours as needed for anxiety or sleep.    [provider]  esomeprazole (NEXIUM) 20 MG capsule Take 40 mg by mouth 2 (two) times daily before a meal.     [provider]  estrogens, conjugated, (PREMARIN) 1.25 MG tablet Take 1.25 mg by mouth daily.    [provider]  fexofenadine (ALLEGRA) 60 MG tablet Take 60 mg by mouth 2 (two) times daily.    [provider]  fluticasone (FLONASE) 50 MCG/ACT nasal spray Place 1 spray into  both nostrils daily.    [provider]  Fluticasone-Salmeterol (ADVAIR) 500-50 MCG/DOSE AEPB Inhale 1 puff into the lungs 2 (two) times daily.    [provider]  folic acid (FOLVITE) 1 MG tablet Take 1 mg by mouth daily.    [provider]  gabapentin (NEURONTIN) 300 MG capsule Take 600 mg by mouth at bedtime.    [provider]  leflunomide (ARAVA) 10 MG tablet Take 10 mg by mouth daily.    [provider]  methocarbamol (ROBAXIN) 500 MG tablet Take 1 tablet (500 mg total) by mouth 2 (two) times daily. 02/10/19   Volanda Napoleon, PA-C  methotrexate 2.5 MG tablet Take 15 mg by mouth once a week.    [provider]  mirabegron ER (MYRBETRIQ) 25 MG TB24 tablet Take 25 mg by mouth 2 (two) times daily.    [provider]  oxyCODONE-acetaminophen (PERCOCET/ROXICET) 5-325 MG tablet Take 2 tablets by mouth every 8 (eight) hours as needed for severe pain. 08/30/17   Fawze, Mina A, PA-C  potassium chloride (K-DUR,KLOR-CON) 10 MEQ tablet Take 10 mEq by mouth 2 (two) times daily.    [provider]  pramipexole (MIRAPEX) 0.25 MG tablet Take 0.25 mg by mouth 3 (three) times daily.    [provider]  predniSONE (DELTASONE) 10 MG tablet Take 40mg  (4 tablets) daily for 4 days, 30mg  (3 tablets) daily for 3 days, 20mg  (2 tablets daily for 2 days) and 10mg  daily for 2 days. 12/16/17   Gareth Morgan, MD  QUEtiapine (SEROQUEL) 400 MG tablet Take 400 mg by mouth at bedtime.    [provider]  senna-docusate (SENOKOT-S) 8.6-50 MG tablet Take 2 tablets by mouth 2 (two) times daily.    [provider]  spironolactone-hydrochlorothiazide (ALDACTAZIDE) 25-25 MG tablet Take 1 tablet by mouth daily.    [provider]  traMADol (ULTRAM) 50 MG tablet Take 100 mg by mouth 3 (three) times daily.    [provider]    Family History No family history on file.  Social History Social History   Tobacco Use   Smoking status: Former Smoker   Smokeless tobacco: Never Used  Substance Use Topics   Alcohol use: No   Drug use: No     Allergies   Aspirin; Brompheniramine; Dimetapp cold-allergy [brompheniramine-phenylephrine]; Ibuprofen; Penicillins; Tolmetin; Doxycycline; and Penicillin g   Review of Systems Review of Systems  Cardiovascular: Negative for chest pain.  Gastrointestinal: Negative for abdominal pain, nausea and vomiting.  Musculoskeletal: Positive for back pain.       Left hip pain  Neurological: Negative for dizziness, weakness and numbness.  All other systems reviewed and are negative.    Physical Exam Updated Vital Signs BP (!) 171/94 (BP Location: Right Arm)    Pulse 98    Temp 98.2 F (36.8 C) (Oral)    Resp 18    Ht 5\' 4"  (1.626 m)     Wt 63.5 kg    SpO2 100%    BMI 24.03 kg/m   Physical Exam Vitals signs and nursing note reviewed.  Constitutional:      Appearance: She is well-developed.  HENT:     Head: Normocephalic and atraumatic.  Eyes:     General: No scleral icterus.       Right eye: No discharge.        Left eye: No discharge.     Conjunctiva/sclera: Conjunctivae normal.  Neck:     Musculoskeletal: Full passive  range of motion without pain.     Comments: Full flexion/extension and lateral movement of neck fully intact. No bony midline tenderness. No deformities or crepitus.  Cardiovascular:     Pulses:          Radial pulses are 2+ on the right side and 2+ on the left side.       Dorsalis pedis pulses are 2+ on the right side and 2+ on the left side.  Pulmonary:     Effort: Pulmonary effort is normal.  Musculoskeletal:     Thoracic back: She exhibits no tenderness.       Back:       Legs:     Comments: Diffuse muscular tenderness in the left paraspinal muscles of the lumbar region that extends over to the left hip.  No bony deformity or crepitus noted.  No overlying ecchymosis.  Extension intact any difficulty.  Flexion intact but with pain.  Unable to assess internal/external rotation secondary to pain.  No bony tenderness noted to left knee, left tib-fib, left ankle.  Full range of motion of right lower extremity without any difficulty.  Skin:    General: Skin is warm and dry.     Comments: Good distal cap refill. LLE is not dusky in appearance or cool to touch.  Neurological:     Mental Status: She is alert.  Psychiatric:        Speech: Speech normal.        Behavior: Behavior normal.      ED Treatments / Results  Labs (all labs ordered are listed, but only abnormal results are displayed) Labs Reviewed - No data to display  EKG None  Radiology Dg Lumbar Spine Complete  Result Date: 02/10/2019 CLINICAL DATA:  Tripped by dog.  Back pain. EXAM: LUMBAR SPINE - COMPLETE 4+ VIEW  COMPARISON:  CT 12/20/2017 FINDINGS: Chronic lumbar curvature convex to the left. Chronic degenerative anterolisthesis L3-4, L4-5 and L5-S1, grade 1 at each level. Chronic disc space narrowing L4-5 and L5-S1. No evidence of acute fracture IMPRESSION: Chronic curvature and degenerative changes.  No acute finding Electronically Signed   By: Nelson Chimes M.D.   On: 02/10/2019 18:58   Dg Hip Unilat W Or Wo Pelvis 2-3 Views Left  Result Date: 02/10/2019 CLINICAL DATA:  Tripped by dog.  Left-sided pain. EXAM: DG HIP (WITH OR WITHOUT PELVIS) 2-3V LEFT COMPARISON:  None. FINDINGS: No evidence of pelvic or hip fracture. Chronic calcifications of fat necrosis seen throughout the region. IMPRESSION: Negative. Electronically Signed   By: Nelson Chimes M.D.   On: 02/10/2019 18:57    Procedures Procedures (including critical care time)  Medications Ordered in ED Medications - No data to display   Initial Impression / Assessment and Plan / ED Course  I have reviewed the triage vital signs and the nursing notes.  Pertinent labs & imaging results that were available during my care of the patient were reviewed by me and considered in my medical decision making (see chart for details).        63 year old female who presents for evaluation of left hip/left back pain after mechanical fall that occurred earlier today.  No preceding chest pain or dizziness.  She reports tripping over her dog.  She reports she has been able to ambulate bear weight but does report worsening pain with doing so.  No head injury, LOC.  No numbness/weakness. Patient is afebrile, non-toxic appearing, sitting comfortably on examination table. Vital signs reviewed and stable.  Patient is neurovascularly intact.  Concern for contusion versus fracture versus dislocation.  History/physical exam not concerning for acute spinal cord injury.  Patient with no neurological deficits.  Will plan for x-ray imaging.  Offered patient nonsedating  analgesics and she drove herself here.  Patient states they do not work for her.  She states she will call somebody to have her be picked up so that she can have pain medication.  X-ray reviewed.  X-ray of hip negative for any acute fracture dislocation.  Lumbar x-ray negative for any acute bony abnormality.  Patient able to ambulate in the department any difficulty.  Do not suspect occult fracture at this time.  Patient still with no ride here.  I discussed with patient that I cannot give her any sedative medication here in ED.  She is still declining any other analgesics.  At this time, patient can ambulate without any difficulty.  I discussed with her that most likely musculoskeletal pain. At this time, patient exhibits no emergent life-threatening condition that require further evaluation in ED or admission. Patient had ample opportunity for questions and discussion. All patient's questions were answered with full understanding. Strict return precautions discussed. Patient expresses understanding and agreement to plan.   Portions of this note were generated with [employer]. Dictation errors may occur despite best attempts at proofreading.    Final Clinical Impressions(s) / ED Diagnoses   Final diagnoses:  Pain of left hip joint    ED Discharge Orders         Ordered    methocarbamol (ROBAXIN) 500 MG tablet  2 times daily     02/10/19 2013           Desma Mcgregor 02/10/19 2116    Jola Schmidt, MD 02/10/19 2206

## 2019-02-10 NOTE — ED Notes (Signed)
Pt in XR. 

## 2019-03-03 ENCOUNTER — Emergency Department (HOSPITAL_BASED_OUTPATIENT_CLINIC_OR_DEPARTMENT_OTHER): Payer: Medicaid Other

## 2019-03-03 ENCOUNTER — Other Ambulatory Visit: Payer: Self-pay

## 2019-03-03 ENCOUNTER — Emergency Department (HOSPITAL_BASED_OUTPATIENT_CLINIC_OR_DEPARTMENT_OTHER)
Admission: EM | Admit: 2019-03-03 | Discharge: 2019-03-03 | Disposition: A | Discharge status: Home / Self Care | Payer: Medicaid Other | Attending: Emergency Medicine | Admitting: Emergency Medicine

## 2019-03-03 ENCOUNTER — Encounter (HOSPITAL_BASED_OUTPATIENT_CLINIC_OR_DEPARTMENT_OTHER): Payer: Self-pay

## 2019-03-03 DIAGNOSIS — S51812A Laceration without foreign body of left forearm, initial encounter: Secondary | ICD-10-CM | POA: Insufficient documentation

## 2019-03-03 DIAGNOSIS — Y999 Unspecified external cause status: Secondary | ICD-10-CM | POA: Diagnosis not present

## 2019-03-03 DIAGNOSIS — Y93K1 Activity, walking an animal: Secondary | ICD-10-CM | POA: Diagnosis not present

## 2019-03-03 DIAGNOSIS — Z87891 Personal history of nicotine dependence: Secondary | ICD-10-CM | POA: Insufficient documentation

## 2019-03-03 DIAGNOSIS — W010XXA Fall on same level from slipping, tripping and stumbling without subsequent striking against object, initial encounter: Secondary | ICD-10-CM | POA: Diagnosis not present

## 2019-03-03 DIAGNOSIS — S59912A Unspecified injury of left forearm, initial encounter: Secondary | ICD-10-CM | POA: Diagnosis present

## 2019-03-03 DIAGNOSIS — Y929 Unspecified place or not applicable: Secondary | ICD-10-CM | POA: Insufficient documentation

## 2019-03-03 DIAGNOSIS — I10 Essential (primary) hypertension: Secondary | ICD-10-CM | POA: Diagnosis not present

## 2019-03-03 DIAGNOSIS — Z79899 Other long term (current) drug therapy: Secondary | ICD-10-CM | POA: Insufficient documentation

## 2019-03-03 DIAGNOSIS — R51 Headache: Secondary | ICD-10-CM | POA: Insufficient documentation

## 2019-03-03 DIAGNOSIS — W19XXXA Unspecified fall, initial encounter: Secondary | ICD-10-CM

## 2019-03-03 MED ORDER — LIDOCAINE 5 % EX PTCH: 1.0000 | MEDICATED_PATCH | CUTANEOUS | 0 refills | Status: AC

## 2019-03-03 MED ORDER — BACITRACIN ZINC 500 UNIT/GM EX OINT: TOPICAL_OINTMENT | Freq: Once | CUTANEOUS | Status: DC

## 2019-03-03 MED ORDER — HYDROCODONE-ACETAMINOPHEN 5-325 MG PO TABS
1.0000 | ORAL_TABLET | Freq: Once | ORAL | Status: AC
  Administered 2019-03-03: 1 via ORAL
  Filled 2019-03-03: qty 1

## 2019-03-03 MED ORDER — LIDOCAINE-EPINEPHRINE (PF) 2 %-1:200000 IJ SOLN
10.0000 mL | Freq: Once | INTRAMUSCULAR | Status: AC
  Administered 2019-03-03: 10 mL
  Filled 2019-03-03 (×2): qty 10

## 2019-03-03 NOTE — ED Provider Notes (Signed)
[facility] Provider Note   CSN: [id number] Arrival date & time: 03/03/19  1614    History   Chief Complaint Chief Complaint  Patient presents with   Fall    HPI Sol Odor is a 63 y.o. female.     The history is provided by the patient and medical records. No language interpreter was used.  Fall    Jennifer Hernandez is a 64 y.o. female  with a PMH as listed below who presents to the Emergency Department for evaluation after mechanical fall just prior to arrival.  Patient states that she was walking her dog when the dog started to pull her and then she tripped over him.  She fell onto her left side.  Her main complaint today is laceration to left forearm which she believes is either from the fall or the dog's claws.  She states that this was not a dog bite.  She is also complaining of left hip pain and shoulder pain.  She thinks she may have hit her head, but does not remember.  She is on anticoagulation.  Denies any loss of consciousness, nausea or vomiting.  No headache or visual changes.  No medications taken prior to arrival for symptoms.  Denies any low back pain, numbness, weakness.  She was able to get up and walk, he does report some pain with ambulation.   Past Medical History:  Diagnosis Date   Arthritis    Hypertension    Leukemia ([place])    Osteoporosis    RA (rheumatoid arthritis) ([place])    Seizures ([place])    last seizure in 2012    Patient Active Problem List   Diagnosis Date Noted   Acute bronchitis 12/17/2016   Rheumatoid arthritis ([place]) 12/17/2016   History of leukemia 12/17/2016   Essential hypertension, benign 12/17/2016   Hypokalemia 12/17/2016    Past Surgical History:  Procedure Laterality Date   ABDOMINAL HYSTERECTOMY     APPENDECTOMY     arm surgery     [facility]       OB History   No obstetric history on file.      Home  Medications    Prior to Admission medications   Medication Sig Start Date End Date Taking? Authorizing Provider  albuterol (PROVENTIL HFA;VENTOLIN HFA) 108 (90 Base) MCG/ACT inhaler Inhale 2 puffs into the lungs every 4 (four) hours as needed for wheezing or shortness of breath.    [provider]  amLODipine (NORVASC) 5 MG tablet Take 5 mg by mouth daily.    [provider]  busPIRone (BUSPAR) 10 MG tablet Take 40 mg by mouth 2 (two) times daily.     [provider]  Calcium Carb-Cholecalciferol (CALCIUM 1000 + D) 1000-800 MG-UNIT TABS Take 1 tablet by mouth daily.    [provider]  citalopram (CELEXA) 20 MG tablet Take 20 mg by mouth daily.    [provider]  diazepam (VALIUM) 10 MG tablet Take 10 mg by mouth every 6 (six) hours as needed for anxiety or sleep.    [provider]  esomeprazole (NEXIUM) 20 MG capsule Take 40 mg by mouth 2 (two) times daily before a meal.     [provider]  estrogens, conjugated, (PREMARIN) 1.25 MG tablet Take 1.25 mg by mouth daily.    [provider]  fexofenadine (ALLEGRA) 60 MG  tablet Take 60 mg by mouth 2 (two) times daily.    [provider]  fluticasone (FLONASE) 50 MCG/ACT nasal spray Place 1 spray into both nostrils daily.    [provider]  Fluticasone-Salmeterol (ADVAIR) 500-50 MCG/DOSE AEPB Inhale 1 puff into the lungs 2 (two) times daily.    [provider]  folic acid (FOLVITE) 1 MG tablet Take 1 mg by mouth daily.    [provider]  gabapentin (NEURONTIN) 300 MG capsule Take 600 mg by mouth at bedtime.    [provider]  leflunomide (ARAVA) 10 MG tablet Take 10 mg by mouth daily.    [provider]  lidocaine (LIDODERM) 5 % Place 1 patch onto the skin daily. Remove & Discard patch within 12 hours or as directed by MD 03/03/19   Chelle Cayton, Ozella Almond, PA-C  methocarbamol (ROBAXIN) 500 MG tablet Take 1 tablet (500 mg total)  by mouth 2 (two) times daily. 02/10/19   Volanda Napoleon, PA-C  methotrexate 2.5 MG tablet Take 15 mg by mouth once a week.    [provider]  mirabegron ER (MYRBETRIQ) 25 MG TB24 tablet Take 25 mg by mouth 2 (two) times daily.    [provider]  oxyCODONE-acetaminophen (PERCOCET/ROXICET) 5-325 MG tablet Take 2 tablets by mouth every 8 (eight) hours as needed for severe pain. 08/30/17   Fawze, Mina A, PA-C  potassium chloride (K-DUR,KLOR-CON) 10 MEQ tablet Take 10 mEq by mouth 2 (two) times daily.    [provider]  pramipexole (MIRAPEX) 0.25 MG tablet Take 0.25 mg by mouth 3 (three) times daily.    [provider]  predniSONE (DELTASONE) 10 MG tablet Take 40mg  (4 tablets) daily for 4 days, 30mg  (3 tablets) daily for 3 days, 20mg  (2 tablets daily for 2 days) and 10mg  daily for 2 days. 12/16/17   Gareth Morgan, MD  QUEtiapine (SEROQUEL) 400 MG tablet Take 400 mg by mouth at bedtime.    [provider]  senna-docusate (SENOKOT-S) 8.6-50 MG tablet Take 2 tablets by mouth 2 (two) times daily.    [provider]  spironolactone-hydrochlorothiazide (ALDACTAZIDE) 25-25 MG tablet Take 1 tablet by mouth daily.    [provider]  traMADol (ULTRAM) 50 MG tablet Take 100 mg by mouth 3 (three) times daily.    [provider]    Family History No family history on file.  Social History Social History   Tobacco Use   Smoking status: Former Smoker   Smokeless tobacco: Never Used  Substance Use Topics   Alcohol use: No   Drug use: No     Allergies   Aspirin; Brompheniramine; Dimetapp cold-allergy [brompheniramine-phenylephrine]; Ibuprofen; Penicillins; Tolmetin; Doxycycline; and Penicillin g   Review of Systems Review of Systems  Musculoskeletal: Positive for arthralgias and myalgias.  Skin: Positive for wound.  All other systems reviewed and are negative.    Physical Exam Updated Vital Signs BP (!) 184/102  (BP Location: Right Arm)    Pulse 97    Temp 98.1 F (36.7 C) (Oral)    Resp 20    Ht 5\' 4"  (1.626 m)    Wt 63.5 kg    SpO2 99%    BMI 24.03 kg/m   Physical Exam Vitals signs and nursing note reviewed.  Constitutional:      General: She is not in acute distress.    Appearance: She is well-developed.  HENT:     Head: Normocephalic and atraumatic.  Neck:  Musculoskeletal: Neck supple.     Comments: + midline tenderness.  Cardiovascular:     Rate and Rhythm: Normal rate and regular rhythm.     Heart sounds: Normal heart sounds. No murmur.  Pulmonary:     Effort: Pulmonary effort is normal. No respiratory distress.     Breath sounds: Normal breath sounds.  Abdominal:     General: There is no distension.     Palpations: Abdomen is soft.     Tenderness: There is no abdominal tenderness.  Musculoskeletal:     Comments: No T/L spine tenderness. Tenderness to palpation of the left hip with no overlying skin changes. Full ROM of the hip. 5/5 muscle strength in all 4 extremities. Good grip strength bilaterally. No tenderness to the wrist/elbow - both of which have full ROM without pain. Diffuse tenderness to anterior shoulder. No crepitus or step-off. All four extremities with equal/intact sensation and 2+ distal pulses.   Skin:    General: Skin is warm and dry.     Comments: Left arm with 4.5 cm "c' shaped laceration.   Neurological:     Mental Status: She is alert and oriented to person, place, and time.     Comments: Speech clear and goal oriented. CN 2-12 grossly intact. Normal finger-to-nose and rapid alternating movements. No drift. Steady gait.       ED Treatments / Results  Labs (all labs ordered are listed, but only abnormal results are displayed) Labs Reviewed - No data to display  EKG None  Radiology Dg Forearm Left  Result Date: 03/03/2019 CLINICAL DATA:  Fall EXAM: LEFT FOREARM - 2 VIEW COMPARISON:  Left wrist 02/18/2017 FINDINGS: Negative for acute fracture  Extensive degenerative change in the wrist joint with joint space narrowing and spurring. Subchondral cyst distal radius. Degenerative change in the carpal bones. Stable ossicles adjacent to the distal ulna. Erosion of the ulnar styloid. IMPRESSION: Negative for fracture. Electronically Signed   By: Franchot Gallo M.D.   On: 03/03/2019 17:49   Ct Head Wo Contrast  Result Date: 03/03/2019 CLINICAL DATA:  Posttraumatic headache after fall. No loss of consciousness. EXAM: CT HEAD WITHOUT CONTRAST CT CERVICAL SPINE WITHOUT CONTRAST TECHNIQUE: Multidetector CT imaging of the head and cervical spine was performed following the standard protocol without intravenous contrast. Multiplanar CT image reconstructions of the cervical spine were also generated. COMPARISON:  CT scan of February 21, 2019. FINDINGS: CT HEAD FINDINGS Brain: Mild chronic ischemic white matter disease is noted. No mass effect or midline shift is noted. Ventricular size is within normal limits. There is no evidence of mass lesion, hemorrhage or acute infarction. Vascular: No hyperdense vessel or unexpected calcification. Skull: Normal. Negative for fracture or focal lesion. Sinuses/Orbits: No acute finding. Other: None. CT CERVICAL SPINE FINDINGS Alignment: Grade 1 anterolisthesis of C3-4 is noted secondary to posterior facet joint hypertrophy. Skull base and vertebrae: No acute fracture. No primary bone lesion or focal pathologic process. Soft tissues and spinal canal: No prevertebral fluid or swelling. No visible canal hematoma. Disc levels: Status post surgical anterior fusion of C4-5, C5-6 and C6-7. Upper chest: Negative. Other: None. IMPRESSION: Mild chronic ischemic white matter disease. No acute intracranial abnormality seen. Postsurgical and degenerative changes in cervical spine as described above. No fracture or other acute abnormality is noted. Electronically Signed   By: Marijo Conception M.D.   On: 03/03/2019 17:14   Ct Cervical Spine Wo  Contrast  Result Date: 03/03/2019 CLINICAL DATA:  Posttraumatic headache after fall.  No loss of consciousness. EXAM: CT HEAD WITHOUT CONTRAST CT CERVICAL SPINE WITHOUT CONTRAST TECHNIQUE: Multidetector CT imaging of the head and cervical spine was performed following the standard protocol without intravenous contrast. Multiplanar CT image reconstructions of the cervical spine were also generated. COMPARISON:  CT scan of February 21, 2019. FINDINGS: CT HEAD FINDINGS Brain: Mild chronic ischemic white matter disease is noted. No mass effect or midline shift is noted. Ventricular size is within normal limits. There is no evidence of mass lesion, hemorrhage or acute infarction. Vascular: No hyperdense vessel or unexpected calcification. Skull: Normal. Negative for fracture or focal lesion. Sinuses/Orbits: No acute finding. Other: None. CT CERVICAL SPINE FINDINGS Alignment: Grade 1 anterolisthesis of C3-4 is noted secondary to posterior facet joint hypertrophy. Skull base and vertebrae: No acute fracture. No primary bone lesion or focal pathologic process. Soft tissues and spinal canal: No prevertebral fluid or swelling. No visible canal hematoma. Disc levels: Status post surgical anterior fusion of C4-5, C5-6 and C6-7. Upper chest: Negative. Other: None. IMPRESSION: Mild chronic ischemic white matter disease. No acute intracranial abnormality seen. Postsurgical and degenerative changes in cervical spine as described above. No fracture or other acute abnormality is noted. Electronically Signed   By: Marijo Conception M.D.   On: 03/03/2019 17:14   Dg Shoulder Left  Result Date: 03/03/2019 CLINICAL DATA:  Fall EXAM: LEFT SHOULDER - 2+ VIEW COMPARISON:  01/07/2016 FINDINGS: Advanced degenerative change in the left shoulder joint with joint space narrowing and spurring. No fracture or dislocation. IMPRESSION: Advanced degenerative change in the left shoulder. Negative for fracture. Electronically Signed   By: Franchot Gallo  M.D.   On: 03/03/2019 17:46   Dg Hip Unilat W Or Wo Pelvis 2-3 Views Left  Result Date: 03/03/2019 CLINICAL DATA:  Fall.  Left hip pain EXAM: DG HIP (WITH OR WITHOUT PELVIS) 2-3V LEFT COMPARISON:  02/10/2019 FINDINGS: There is no evidence of hip fracture or dislocation. There is no evidence of arthropathy or other focal bone abnormality. Multiple soft tissue calcifications in the buttocks bilaterally. IMPRESSION: Negative. Electronically Signed   By: Franchot Gallo M.D.   On: 03/03/2019 17:50    Procedures .Marland KitchenLaceration Repair Date/Time: 03/03/2019 6:37 PM Performed by: Laverta Harnisch, Ozella Almond, PA-C Authorized by: Zyaire Mccleod, Ozella Almond, PA-C   Consent:    Consent obtained:  Verbal   Consent given by:  Patient   Risks discussed:  Pain, infection, poor cosmetic result and poor wound healing Anesthesia (see MAR for exact dosages):    Anesthesia method:  Local infiltration   Local anesthetic:  Lidocaine 2% w/o epi Laceration details:    Location:  Shoulder/arm   Shoulder/arm location:  L lower arm   Length (cm):  4.5 Repair type:    Repair type:  Simple Pre-procedure details:    Preparation:  Patient was prepped and draped in usual sterile fashion Exploration:    Hemostasis achieved with:  Direct pressure   Wound exploration: wound explored through full range of motion and entire depth of wound probed and visualized   Treatment:    Area cleansed with:  Saline   Amount of cleaning:  Standard   Irrigation solution:  Sterile saline Skin repair:    Repair method:  Sutures   Suture size:  5-0   Suture material:  Prolene   Suture technique:  Simple interrupted   Number of sutures:  5 Approximation:    Laceration repair approximation: Close however one small part is more of a skin tear which would  not approximate well and was left open. Post-procedure details:    Patient tolerance of procedure:  Tolerated well, no immediate complications   (including critical care time)  Medications  Ordered in ED Medications  bacitracin ointment (has no administration in time range)  lidocaine-EPINEPHrine (XYLOCAINE W/EPI) 2 %-1:200000 (PF) injection 10 mL (10 mLs Infiltration Given by Other 03/03/19 1730)  HYDROcodone-acetaminophen (NORCO/VICODIN) 5-325 MG per tablet 1 tablet (1 tablet Oral Given 03/03/19 1733)     Initial Impression / Assessment and Plan / ED Course  I have reviewed the triage vital signs and the nursing notes.  Pertinent labs & imaging results that were available during my care of the patient were reviewed by me and considered in my medical decision making (see chart for details).       Jennifer Hernandez is a 63 y.o. female who presents to ED for evaluation after mechanical fall just prior to arrival.  Did sustain laceration to the forearm which was repaired as dictated above.  Her tetanus is already up-to-date.  X-rays were negative.  No focal neurologic deficits.  She is on anticoagulation.  She did have midline cervical spine tenderness.  CT head and c-spine with no acute findings. Evaluation does not show pathology that would require ongoing emergent intervention or inpatient treatment.  Counseled on home wound care, follow-up care and return precautions.  All questions answered.   Final Clinical Impressions(s) / ED Diagnoses   Final diagnoses:  Fall, initial encounter  Laceration of left forearm, initial encounter    ED Discharge Orders         Ordered    lidocaine (LIDODERM) 5 %  Every 24 hours     03/03/19 1821           Deserai Cansler, Ozella Almond, PA-C 03/03/19 1842    Drenda Freeze, MD 03/03/19 2113

## 2019-03-03 NOTE — Discharge Instructions (Signed)
It was my pleasure taking care of you today!   Keep wound clean with mild soap and water. Keep area covered with a topical antibiotic ointment and bandage, keep bandage dry, and do not submerge in water for 24 hours. Ice and elevate for additional pain relief and swelling. Lidocaine patches can be applied to hip / shoulder. Follow up with your primary care doctor, [facility] or ER in approximately 7 days for wound recheck and suture removal. Monitor area for signs of infection to include, but not limited to: increasing pain, spreading redness, drainage/pus, worsening swelling, or fevers. Return to emergency department for emergent changing or worsening symptoms.   WOUND CARE Keep area clean and dry for 24 hours. Do not remove bandage, if applied. After 24 hours,you should change it at least once a day. Also, change the dressing if it becomes wet or dirty, or as directed by your caregiver.  Wash the wound with soap and water 2 times a day. Rinse the wound off with water to remove all soap. Pat the wound dry with a clean towel.  You may shower as usual after the first 24 hours. Do not soak the wound in water until the sutures are removed.  Return if you experience any of the following signs of infection: Swelling, redness, pus drainage, streaking, fever >100.4 F Return if you experience excessive bleeding that does not stop after 15-20 minutes of constant, firm pressure.

## 2019-03-03 NOTE — ED Triage Notes (Signed)
Pt reports that her dog pulled her over and made her fall to the ground. Pt reports skin tear to L forearm. Pt denies striking her head. Pt states she is on blood thinners. Wound is hemostatic.

## 2019-06-12 ENCOUNTER — Other Ambulatory Visit: Payer: Self-pay

## 2019-06-12 ENCOUNTER — Emergency Department (HOSPITAL_BASED_OUTPATIENT_CLINIC_OR_DEPARTMENT_OTHER)
Admission: EM | Admit: 2019-06-12 | Discharge: 2019-06-12 | Disposition: A | Discharge status: Home / Self Care | Payer: Medicaid Other | Attending: Emergency Medicine | Admitting: Emergency Medicine

## 2019-06-12 ENCOUNTER — Emergency Department (HOSPITAL_BASED_OUTPATIENT_CLINIC_OR_DEPARTMENT_OTHER): Payer: Medicaid Other

## 2019-06-12 ENCOUNTER — Encounter (HOSPITAL_BASED_OUTPATIENT_CLINIC_OR_DEPARTMENT_OTHER): Payer: Self-pay | Admitting: *Deleted

## 2019-06-12 DIAGNOSIS — Z87891 Personal history of nicotine dependence: Secondary | ICD-10-CM | POA: Insufficient documentation

## 2019-06-12 DIAGNOSIS — Y92481 Parking lot as the place of occurrence of the external cause: Secondary | ICD-10-CM | POA: Diagnosis not present

## 2019-06-12 DIAGNOSIS — M25561 Pain in right knee: Secondary | ICD-10-CM | POA: Insufficient documentation

## 2019-06-12 DIAGNOSIS — Y9301 Activity, walking, marching and hiking: Secondary | ICD-10-CM | POA: Diagnosis not present

## 2019-06-12 DIAGNOSIS — S81812A Laceration without foreign body, left lower leg, initial encounter: Secondary | ICD-10-CM | POA: Insufficient documentation

## 2019-06-12 DIAGNOSIS — W19XXXA Unspecified fall, initial encounter: Secondary | ICD-10-CM

## 2019-06-12 DIAGNOSIS — I10 Essential (primary) hypertension: Secondary | ICD-10-CM | POA: Diagnosis not present

## 2019-06-12 DIAGNOSIS — T148XXA Other injury of unspecified body region, initial encounter: Secondary | ICD-10-CM | POA: Insufficient documentation

## 2019-06-12 DIAGNOSIS — Y999 Unspecified external cause status: Secondary | ICD-10-CM | POA: Insufficient documentation

## 2019-06-12 DIAGNOSIS — Z79899 Other long term (current) drug therapy: Secondary | ICD-10-CM | POA: Insufficient documentation

## 2019-06-12 DIAGNOSIS — M069 Rheumatoid arthritis, unspecified: Secondary | ICD-10-CM | POA: Insufficient documentation

## 2019-06-12 DIAGNOSIS — W010XXA Fall on same level from slipping, tripping and stumbling without subsequent striking against object, initial encounter: Secondary | ICD-10-CM | POA: Insufficient documentation

## 2019-06-12 MED ORDER — LIDOCAINE-EPINEPHRINE (PF) 2 %-1:200000 IJ SOLN
INTRAMUSCULAR | Status: AC
  Administered 2019-06-12: 10 mL via INTRADERMAL
  Filled 2019-06-12: qty 10

## 2019-06-12 MED ORDER — LIDOCAINE-EPINEPHRINE 2 %-1:100000 IJ SOLN
20.0000 mL | Freq: Once | INTRAMUSCULAR | Status: DC
  Filled 2019-06-12: qty 20

## 2019-06-12 MED ORDER — LIDOCAINE-EPINEPHRINE (PF) 2 %-1:200000 IJ SOLN
10.0000 mL | Freq: Once | INTRAMUSCULAR | Status: AC
  Administered 2019-06-12: 21:00:00 10 mL via INTRADERMAL
  Filled 2019-06-12: qty 10

## 2019-06-12 MED ORDER — HYDROCODONE-ACETAMINOPHEN 5-325 MG PO TABS
1.0000 | ORAL_TABLET | Freq: Once | ORAL | Status: AC
  Administered 2019-06-12: 1 via ORAL
  Filled 2019-06-12: qty 1

## 2019-06-12 NOTE — ED Notes (Signed)
Laceration to right shin measures, 9 cm x 2 cm ( just below the patella); another skin tear underneath the laceration is another laceration measures 3 cm x 0.8 cm and another 3 cm by 0.8 cm.  Another laceration to RPFA measure 4 cm x 1 cm; laceration to right  elbow measures 2 cm x 0.3 cm;another open skin area to RPFA measures 2 cm x 1 cm; skin tear to right wrist measures 1 cm; right thumb laceration measures 1 cm; right palm laceration measures 1 cm x 1 cm.  Abrasion to right  4th and fifth knuckles.   Three kin tears to left forearm measures 1 cm x 0.5 cm; 1.8 cm x 1 cm; 3 cm x 0.3 cm.

## 2019-06-12 NOTE — ED Triage Notes (Signed)
She lost her balance and fell. Injury to her right lower leg with laceration. Abrasions to her right thumb and right elbow and forearm.

## 2019-06-12 NOTE — Discharge Instructions (Signed)
Please follow-up for suture removal at either urgent care ,the emergency department, or your primary care doctor in 10-14 days.  Please return to the emergency room immediately if you experience any new or worsening symptoms or any symptoms that indicate worsening infection such as fevers, increased redness/swelling/pain, warmth, or drainage from the affected area.   

## 2019-06-12 NOTE — ED Notes (Signed)
Steri-strip applied to skin tear on right forearm. All wounds bandaged.

## 2019-06-12 NOTE — ED Provider Notes (Signed)
[facility] EMERGENCY DEPARTMENT Provider Note   CSN: [id number] Arrival date & time: 06/12/19  4765    History   Chief Complaint Chief Complaint  Patient presents with   Fall    HPI Cash Meadow is a 63 y.o. female.     HPI  Pt is a 63 y/o female with a h/o arthritis, hypertension, leukemia, osteoporosis, RA, seizures, who presents to the ED today after a fall. States she was walking in a parking lot when she tripped and fell onto her right side. She denies head trauma or LOC. She has multiple abrasions and lacerations to the body. States that she hurts all over but her main source of pain is her right knee. Denies any neck pain or back pain. Pain is constant and severe. She has not been able to bear weight on it. She also c/o pain to the right hand and thumb.  States she had Tdap less than 5 years ago.  Past Medical History:  Diagnosis Date   Arthritis    Hypertension    Leukemia ([place])    Osteoporosis    RA (rheumatoid arthritis) ([place])    Seizures ([place])    last seizure in 2012    Patient Active Problem List   Diagnosis Date Noted   Acute bronchitis 12/17/2016   Rheumatoid arthritis ([place]) 12/17/2016   History of leukemia 12/17/2016   Essential hypertension, benign 12/17/2016   Hypokalemia 12/17/2016    Past Surgical History:  Procedure Laterality Date   ABDOMINAL HYSTERECTOMY     APPENDECTOMY     arm surgery     [place]       OB History   No obstetric history on file.      Home Medications    Prior to Admission medications   Medication Sig Start Date End Date Taking? Authorizing Provider  albuterol (PROVENTIL HFA;VENTOLIN HFA) 108 (90 Base) MCG/ACT inhaler Inhale 2 puffs into the lungs every 4 (four) hours as needed for wheezing or shortness of breath.    [provider]  amLODipine (NORVASC) 5 MG tablet Take 5 mg by mouth daily.    [provider]  busPIRone (BUSPAR) 10 MG tablet Take 40 mg by mouth 2 (two) times daily.     [provider]  Calcium Carb-Cholecalciferol (CALCIUM 1000 + D) 1000-800 MG-UNIT TABS Take 1 tablet by mouth daily.    [provider]  citalopram (CELEXA) 20 MG tablet Take 20 mg by mouth daily.    [provider]  diazepam (VALIUM) 10 MG tablet Take 10 mg by mouth every 6 (six) hours as needed for anxiety or sleep.    [provider]  esomeprazole (NEXIUM) 20 MG capsule Take 40 mg by mouth 2 (two) times daily before a meal.     [provider]  estrogens, conjugated, (PREMARIN) 1.25 MG tablet Take 1.25 mg by mouth daily.    [provider]  fexofenadine (ALLEGRA) 60 MG tablet Take 60 mg by mouth 2 (two) times daily.    [provider]  fluticasone (FLONASE) 50 MCG/ACT nasal spray Place 1 spray into both nostrils daily.    [provider]  Fluticasone-Salmeterol (ADVAIR) 500-50 MCG/DOSE AEPB Inhale 1 puff into the lungs 2 (two) times daily.    [provider]  folic acid (FOLVITE) 1 MG tablet Take 1 mg by mouth daily.  [provider]  gabapentin (NEURONTIN) 300 MG capsule Take 600 mg by mouth at bedtime.    [provider]  leflunomide (ARAVA) 10 MG tablet Take 10 mg by mouth daily.    [provider]  lidocaine (LIDODERM) 5 % Place 1 patch onto the skin daily. Remove & Discard patch within 12 hours or as directed by MD 03/03/19   Ward, Ozella Almond, PA-C  methocarbamol (ROBAXIN) 500 MG tablet Take 1 tablet (500 mg total) by mouth 2 (two) times daily. 02/10/19   Volanda Napoleon, PA-C  methotrexate 2.5 MG tablet Take 15 mg by mouth once a week.    [provider]  mirabegron ER (MYRBETRIQ) 25 MG TB24 tablet Take 25 mg by mouth 2 (two) times daily.    [provider]  oxyCODONE-acetaminophen (PERCOCET/ROXICET) 5-325 MG tablet Take 2 tablets by mouth every 8 (eight) hours as needed  for severe pain. 08/30/17   Fawze, Mina A, PA-C  potassium chloride (K-DUR,KLOR-CON) 10 MEQ tablet Take 10 mEq by mouth 2 (two) times daily.    [provider]  pramipexole (MIRAPEX) 0.25 MG tablet Take 0.25 mg by mouth 3 (three) times daily.    [provider]  predniSONE (DELTASONE) 10 MG tablet Take 40mg  (4 tablets) daily for 4 days, 30mg  (3 tablets) daily for 3 days, 20mg  (2 tablets daily for 2 days) and 10mg  daily for 2 days. 12/16/17   Gareth Morgan, MD  QUEtiapine (SEROQUEL) 400 MG tablet Take 400 mg by mouth at bedtime.    [provider]  senna-docusate (SENOKOT-S) 8.6-50 MG tablet Take 2 tablets by mouth 2 (two) times daily.    [provider]  spironolactone-hydrochlorothiazide (ALDACTAZIDE) 25-25 MG tablet Take 1 tablet by mouth daily.    [provider]  traMADol (ULTRAM) 50 MG tablet Take 100 mg by mouth 3 (three) times daily.    [provider]    Family History No family history on file.  Social History Social History   Tobacco Use   Smoking status: Former Smoker   Smokeless tobacco: Never Used  Substance Use Topics   Alcohol use: No   Drug use: No     Allergies   Aspirin, Brompheniramine, Dimetapp cold-allergy [brompheniramine-phenylephrine], Ibuprofen, Penicillins, Tolmetin, Doxycycline, and Penicillin g   Review of Systems Review of Systems  Constitutional: Negative for chills and fever.  HENT: Negative for ear pain and sore throat.   Eyes: Negative for visual disturbance.  Respiratory: Negative for shortness of breath.   Cardiovascular: Negative for chest pain.  Gastrointestinal: Negative for abdominal pain and vomiting.  Genitourinary: Negative for flank pain.  Musculoskeletal: Negative for back pain and neck pain.       Right knee pain, right hand pain  Skin: Positive for wound.  Neurological: Negative for headaches.       No head trauma or loc  All other systems reviewed and are  negative.    Physical Exam Updated Vital Signs BP (!) 151/80 (BP Location: Right Arm)    Pulse 85    Temp 98.1 F (36.7 C) (Oral)    Resp 14    Ht 5\' 3"  (1.6 m)    Wt 63.5 kg    SpO2 99%    BMI 24.80 kg/m   Physical Exam Vitals signs and nursing note reviewed.  Constitutional:      General: She is not in acute distress.    Appearance: She is well-developed.  HENT:     Head: Normocephalic  and atraumatic.  Eyes:     Conjunctiva/sclera: Conjunctivae normal.  Neck:     Musculoskeletal: Neck supple.  Cardiovascular:     Rate and Rhythm: Normal rate and regular rhythm.     Heart sounds: No murmur.  Pulmonary:     Effort: Pulmonary effort is normal. No respiratory distress.     Breath sounds: Normal breath sounds.  Abdominal:     Palpations: Abdomen is soft.     Tenderness: There is no abdominal tenderness.  Musculoskeletal:     Comments: No TTP to the CT or L-spine.  Mild tenderness over the left thumb.  No tenderness the remainder of the hand or wrist.  TTP to the right knee, proximal right tib/fib and right lateral malleolus.  No tenderness throughout the right foot.  Skin:    General: Skin is warm and dry.     Comments: Multiple skin tears noted to the right forearm, right elbow and right forearm, right hand and right shin.  She also has a large 7 centimeter laceration just inferior to the right knee with no active bleeding.  Neurological:     Mental Status: She is alert.     ED Treatments / Results  Labs (all labs ordered are listed, but only abnormal results are displayed) Labs Reviewed - No data to display  EKG None  Radiology Dg Tibia/fibula Right  Result Date: 06/12/2019 CLINICAL DATA:  Fall, pain EXAM: RIGHT TIBIA AND FIBULA - 2 VIEW COMPARISON:  None. FINDINGS: No fracture or dislocation is seen. Mild tricompartmental degenerative changes at the knee, most prominent in the medial compartment. Suspected lateral compartment chondrocalcinosis. Small suprapatellar  knee joint effusion. Ankle joint is preserved. IMPRESSION: No fracture or dislocation is seen. Mild degenerative changes at the knee, as above. Electronically Signed   By: Julian Hy M.D.   On: 06/12/2019 22:27   Dg Ankle Complete Right  Result Date: 06/12/2019 CLINICAL DATA:  Fall, pain EXAM: RIGHT ANKLE - COMPLETE 3+ VIEW COMPARISON:  None. FINDINGS: No fracture or dislocation is seen. The ankle mortise is intact. The visualized soft tissues are unremarkable. IMPRESSION: Negative. Electronically Signed   By: Julian Hy M.D.   On: 06/12/2019 22:26   Dg Knee Complete 4 Views Right  Result Date: 06/12/2019 CLINICAL DATA:  Fall, pain EXAM: RIGHT KNEE - COMPLETE 4+ VIEW COMPARISON:  None. FINDINGS: Mild to moderate tricompartmental degenerative changes, most prominent in the medial compartment. Lateral compartment chondrocalcinosis. Small suprapatellar knee joint effusion. No fracture or dislocation is seen. IMPRESSION: No fracture or dislocation is seen. Mild to moderate tricompartmental degenerative changes, as above. Electronically Signed   By: Julian Hy M.D.   On: 06/12/2019 22:27   Dg Hand Complete Right  Result Date: 06/12/2019 CLINICAL DATA:  Fall EXAM: RIGHT HAND - COMPLETE 3+ VIEW COMPARISON:  09/09/2017 FINDINGS: Old/healed fracture of the 1st distal phalanx. No evidence of acute fracture or dislocation. Chronic subluxation of the second through 4th MCP joints. Stable SLAC wrist with degenerative changes of the radiocarpal articulation. Status post ORIF of the distal ulnar shaft. IMPRESSION: No evidence of acute fracture or dislocation. Stable posttraumatic and chronic degenerative changes, as above. Electronically Signed   By: Julian Hy M.D.   On: 06/12/2019 22:26    Procedures .Marland KitchenLaceration Repair  Date/Time: 06/12/2019 10:56 PM Performed by: Rodney Booze, PA-C Authorized by: Rodney Booze, PA-C   Consent:    Consent obtained:  Verbal   Consent  given by:  Patient  Risks discussed:  Infection, pain, retained foreign body, need for additional repair and poor cosmetic result   Alternatives discussed:  No treatment Anesthesia (see MAR for exact dosages):    Anesthesia method:  Local infiltration   Local anesthetic:  Lidocaine 2% w/o epi Laceration details:    Location:  Leg   Leg location:  L lower leg   Length (cm):  7 Repair type:    Repair type:  Simple Pre-procedure details:    Preparation:  Patient was prepped and draped in usual sterile fashion and imaging obtained to evaluate for foreign bodies Exploration:    Hemostasis achieved with:  Direct pressure   Wound exploration: wound explored through full range of motion and entire depth of wound probed and visualized     Contaminated: no   Treatment:    Area cleansed with:  Saline   Amount of cleaning:  Standard   Irrigation solution:  Sterile saline   Irrigation volume:  1L   Irrigation method:  Pressure wash   Visualized foreign bodies/material removed: no   Skin repair:    Repair method:  Sutures   Suture size:  4-0   Suture material:  Prolene   Suture technique:  Simple interrupted and vertical mattress   Number of sutures:  12 Approximation:    Approximation:  Close Post-procedure details:    Dressing:  Open (no dressing)   Patient tolerance of procedure:  Tolerated well, no immediate complications   (including critical care time)  Medications Ordered in ED Medications  HYDROcodone-acetaminophen (NORCO/VICODIN) 5-325 MG per tablet 1 tablet (1 tablet Oral Given 06/12/19 2058)  lidocaine-EPINEPHrine (XYLOCAINE W/EPI) 2 %-1:200000 (PF) injection 10 mL (10 mLs Intradermal Given 06/12/19 2059)     Initial Impression / Assessment and Plan / ED Course  I have reviewed the triage vital signs and the nursing notes.  Pertinent labs & imaging results that were available during my care of the patient were reviewed by me and considered in my medical decision making  (see chart for details).     Final Clinical Impressions(s) / ED Diagnoses   Final diagnoses:  Fall, initial encounter  Laceration of left lower extremity, initial encounter  Multiple skin tears  Acute pain of right knee   63 year old female presenting after mechanical fall prior to arrival.  Complaining of pain to the right hand and right knee.  Has multiple skin tears and a laceration to the right knee.  She has some hypertension, but otherwise her vital signs are reassuring.  She has no midline tenderness to the cervical, thoracic or lumbar spine.  She does have some tenderness to the right knee, right tib-fib and right ankle.  She has multiple skin tears and a large laceration just inferior to the right knee.  Her Tdap is up-to-date.  X-ray right hand negative for acute fracture or dislocation X-ray right knee negative for acute fracture or dislocation X-ray right tib/fib negative for acute fracture or dislocation X-ray right ankle negative for acute fracture or dislocation  Laceration repairs were completed.  Patient's Tdap is reportedly up-to-date.  She tolerated procedure well.  Advised to return in 10 to 14 days for suture removal.  She was able to ambulate in the D ED.  Advised her normal pain medications at home.  Advised on specific return precautions.  She voices understanding of plan and reasons to return.  All questions answered.  Patient stable for discharge.  ED Discharge Orders    None  Rodney Booze, PA-C 06/12/19 2258    Fredia Sorrow, MD 06/15/19 [phone]
